# Patient Record
Sex: Male | Born: 1935
Health system: Southern US, Community
[De-identification: ages and names within clinical notes are randomized; demographics above are authoritative.]

## PROBLEM LIST (undated history)

## (undated) DIAGNOSIS — I10 Essential (primary) hypertension: Secondary | ICD-10-CM

## (undated) DIAGNOSIS — N182 Chronic kidney disease, stage 2 (mild): Secondary | ICD-10-CM

## (undated) DIAGNOSIS — I509 Heart failure, unspecified: Secondary | ICD-10-CM

## (undated) DIAGNOSIS — I219 Acute myocardial infarction, unspecified: Secondary | ICD-10-CM

## (undated) HISTORY — PX: CORONARY STENT PLACEMENT: SHX1402

## (undated) HISTORY — DX: Acute myocardial infarction, unspecified: I21.9

---

## 2001-12-23 ENCOUNTER — Emergency Department (HOSPITAL_COMMUNITY): Admission: EM | Admit: 2001-12-23 | Discharge: 2001-12-23 | Payer: Self-pay | Admitting: *Deleted

## 2003-08-11 ENCOUNTER — Emergency Department (HOSPITAL_COMMUNITY): Admission: AD | Admit: 2003-08-11 | Discharge: 2003-08-11 | Payer: Self-pay | Admitting: Family Medicine

## 2003-08-13 ENCOUNTER — Emergency Department (HOSPITAL_COMMUNITY): Admission: AD | Admit: 2003-08-13 | Discharge: 2003-08-13 | Payer: Self-pay | Admitting: Family Medicine

## 2003-08-22 ENCOUNTER — Emergency Department (HOSPITAL_COMMUNITY): Admission: AD | Admit: 2003-08-22 | Discharge: 2003-08-22 | Payer: Self-pay | Admitting: Family Medicine

## 2007-12-05 ENCOUNTER — Inpatient Hospital Stay (HOSPITAL_COMMUNITY): Admission: EM | Admit: 2007-12-05 | Discharge: 2007-12-08 | Payer: Self-pay | Admitting: Emergency Medicine

## 2007-12-05 ENCOUNTER — Ambulatory Visit: Payer: Self-pay | Admitting: Cardiovascular Disease

## 2007-12-12 ENCOUNTER — Ambulatory Visit: Payer: Self-pay | Admitting: Cardiovascular Disease

## 2007-12-21 ENCOUNTER — Encounter (HOSPITAL_COMMUNITY): Admission: RE | Admit: 2007-12-21 | Discharge: 2008-03-20 | Payer: Self-pay | Admitting: Cardiovascular Disease

## 2008-01-05 ENCOUNTER — Ambulatory Visit: Payer: Self-pay | Admitting: Cardiology

## 2008-03-21 ENCOUNTER — Ambulatory Visit: Payer: Self-pay | Admitting: Cardiovascular Disease

## 2008-03-22 ENCOUNTER — Encounter (HOSPITAL_COMMUNITY): Admission: RE | Admit: 2008-03-22 | Discharge: 2008-04-24 | Payer: Self-pay | Admitting: Cardiovascular Disease

## 2008-03-29 ENCOUNTER — Ambulatory Visit: Payer: Self-pay | Admitting: Cardiology

## 2008-07-31 ENCOUNTER — Ambulatory Visit: Payer: Self-pay | Admitting: Cardiology

## 2008-08-26 ENCOUNTER — Ambulatory Visit: Payer: Self-pay | Admitting: Cardiovascular Disease

## 2008-10-31 DIAGNOSIS — E039 Hypothyroidism, unspecified: Secondary | ICD-10-CM

## 2008-10-31 DIAGNOSIS — F17200 Nicotine dependence, unspecified, uncomplicated: Secondary | ICD-10-CM

## 2008-10-31 DIAGNOSIS — I252 Old myocardial infarction: Secondary | ICD-10-CM | POA: Insufficient documentation

## 2008-10-31 DIAGNOSIS — I2510 Atherosclerotic heart disease of native coronary artery without angina pectoris: Secondary | ICD-10-CM

## 2008-10-31 DIAGNOSIS — I10 Essential (primary) hypertension: Secondary | ICD-10-CM

## 2008-11-27 ENCOUNTER — Ambulatory Visit: Payer: Self-pay | Admitting: Cardiology

## 2009-04-23 ENCOUNTER — Ambulatory Visit: Payer: Self-pay | Admitting: Cardiovascular Disease

## 2009-04-23 DIAGNOSIS — E782 Mixed hyperlipidemia: Secondary | ICD-10-CM

## 2009-06-04 ENCOUNTER — Ambulatory Visit: Payer: Self-pay | Admitting: Cardiology

## 2009-06-05 ENCOUNTER — Encounter: Payer: Self-pay | Admitting: Cardiology

## 2009-09-26 ENCOUNTER — Encounter (INDEPENDENT_AMBULATORY_CARE_PROVIDER_SITE_OTHER): Payer: Self-pay | Admitting: *Deleted

## 2009-11-26 ENCOUNTER — Encounter: Payer: Self-pay | Admitting: Cardiovascular Disease

## 2009-11-27 ENCOUNTER — Ambulatory Visit: Payer: Self-pay | Admitting: Cardiology

## 2010-01-20 ENCOUNTER — Encounter (INDEPENDENT_AMBULATORY_CARE_PROVIDER_SITE_OTHER): Payer: Self-pay | Admitting: *Deleted

## 2010-03-02 ENCOUNTER — Emergency Department (HOSPITAL_COMMUNITY): Admission: EM | Admit: 2010-03-02 | Discharge: 2010-03-03 | Payer: Self-pay | Admitting: Emergency Medicine

## 2010-03-07 ENCOUNTER — Emergency Department (HOSPITAL_COMMUNITY): Admission: EM | Admit: 2010-03-07 | Discharge: 2010-03-07 | Payer: Self-pay | Admitting: Family Medicine

## 2010-03-12 ENCOUNTER — Encounter (INDEPENDENT_AMBULATORY_CARE_PROVIDER_SITE_OTHER): Payer: Self-pay | Admitting: *Deleted

## 2010-11-24 NOTE — Letter (Signed)
Summary: Appointment - Reminder 2  [employer], Main Office  [address]   [place], [place] [address]   Phone: [phone]  Fax: [phone]     Mar 12, 2010 MRN: [id number]   [name] [name] [address] [place], [place]  [address]   Dear [name],  Our records indicate that it is time to schedule a follow-up appointment with Dr. Eden Emms. It is very important that we reach you to schedule this appointment. We look forward to participating in your health care needs. Please contact us at the number listed above at your earliest convenience to schedule your appointment.  If you are unable to make an appointment at this time, give [place] a call so we can update our records.     Sincerely,   Migd[facility] D[facility] Scheduling Team

## 2010-11-24 NOTE — Miscellaneous (Signed)
  Clinical Lists Changes  Observations: Added new observation of RS STUDY: TRACER - study completion 11/26/09 (01/20/2010 11:31)      Research Study Name: TRACER - study completion 11/26/09

## 2011-03-09 NOTE — Discharge Summary (Signed)
NAME:  [name] [name], [name] [name]                 ACCOUNT NO.:  [id number]   MEDICAL RECORD NO.:  [id number]          PATIENT TYPE:  INP   LOCATION:  2041                         FACILITY:  [facility]   PHYSICIAN:  Jonelle Sidle, MD DATE OF BIRTH:  [birth year]   DATE OF ADMISSION:  12/05/2007  DATE OF DISCHARGE:  12/08/2007                               DISCHARGE SUMMARY   PROCEDURES:  1. Cardiac catheterization.  2. Coronary arteriogram.  3. Left ventriculogram.   PRIMARY FINAL DISCHARGE DIAGNOSES:  Non-ST segment elevation myocardial  infarction.   SECONDARY DIAGNOSIS:  1. Reported history of hypertension, borderline.  2. Ongoing tobacco use.  3. Dyslipidemia with a total cholesterol of 136, triglycerides 39, HDL      35, LDL 93.  4. Borderline hypothyroidism with a TSH of 0.153, free T4 1.09 and      free T3 2.0.   Time at discharge 43 minutes.   HOSPITAL COURSE:  [name] [name] is a 77 year old male with no previous  history of coronary artery disease.  He had chest pain which started  4:30 p.m. the day before admission and was intermittent.  He went to  urgent care at 2 p.m. on the day of admission and had an abnormal EKG as  well as ongoing mild chest discomfort.  He was taken to the cath lab.   His initial cardiac enzymes were elevated.  A heart catheterization  showed an EF in excess of 60% with a small area of inferobasal  hypokinesis, LAD 30, D1 50, ramus 70, circumflex normal, RCA 40-50,  thrombotic clot at the takeoff of a large PDA felt to be the culprit  lesion, bridging collaterals seen.  Dr. Riley Kill evaluated the films and  felt that medical therapy for this was recommended with aggressive  cardiac risk factor reduction.   A lipid profile is described above.  Initially he was placed on Lipitor  80 mg a day.  However, because of the need for him to be on  prescriptions available from [employer] for 4 dollars this was changed to  Pravachol 40.  His blood pressure initially was too  low to add an ACE  inhibitor or beta-blocker but eventually this improved somewhat and he  was started on low dose Coreg.  He was seen by cardiac rehab and given  information on the heart healthy lifestyle.  He was also given  information on smoking cessation but stated that he had decided to quit  and told his family of this decision.   By December 08, 2007, [name] [name] was ambulating 500 feet without chest  pain or shortness of breath and post ambulation blood pressure was  130/72 with a heart rate of 85.  He had been enrolled in the tracer  study by the research team on admission and he was provided with this  medication and instructions on taking it.  He was seen by smoking  cessation and the importance of tobacco cessation was reinforced.  On  December 08, 2007, [name] [name] was seen by Dr. Diona Browner.  Dr. Diona Browner  felt that it would be  ideal for him to stay another day but [name] [name]  felt strongly that he should be discharged.  He was ambulating without  chest pain or shortness of breath and discharged home on December 08, 2007.   DISCHARGE INSTRUCTIONS:  His activity level is to be increased  gradually.  He is not to do any driving for 4 days and no lifting for 2  weeks.  He is to call our office for problems with the cath site.  He  has a followup appoint with Dr. Eden Emms for medication titration on  February 17 at 9 a.m.  He also has a followup with Dr. Eden Emms on March 2  at 1:45.   DISCHARGE MEDICATIONS:  1. Pravachol 40 mg daily.  2. Aspirin 325 mg daily.  3. Nitroglycerin sublingual p.r.n.  4. Tracer study medication as directed.  5. Imdur 30 mg 1/2 tablet daily.  6. Coreg 3.125 mg b.i.d.  7. Plavix 75 mg daily.      Theodore Demark, PA-C      Jonelle Sidle, MD  Electronically Signed    RB/MEDQ  D:  12/08/2007  T:  12/10/2007  Job:  [id number]

## 2011-03-09 NOTE — Assessment & Plan Note (Signed)
[facility]                            CARDIOLOGY OFFICE NOTE   NAME:Pucillo, Yeudiel                          MRN:          [id number]  DATE:08/26/2008                            DOB:          08-May-1933    HISTORY OF PRESENT ILLNESS:  [name] [name] returns today for followup.  He has  had a history of an inferior wall MI in February 2009.  He was treated  with stenting of the RCA. He has not had any residual chest pain.  He  finished cardiac rehab.  He still needs to get a primary care doctor.  He used to see Dr. Margaretmary Bayley, I told him to see if he could get in  with him and if not I would arrange followup with him at the [facility].  He is getting assistance for his Plavix.   He is active.  He is not having significant chest pain, PND, or  orthopnea.  His coronary risk factors are well modified.  He needs to  have a followup lipid and liver profile.  We will arrange this in the  next 3-4 weeks.  Review of systems is negative.  He has been enjoying  himself, cooking fish quite a bit.  He seems to be a little more  attentive to his healthcare needs.  When I initially saw him with his  MI, he had not been seen by a doctor in 25 years.   CURRENT MEDICATIONS:  1. Plavix 75 a day.  2. Carvedilol 3.125 b.i.d.  3. Pravastatin 40 a day.  4. Aspirin a day.  5. Lisinopril 40 a day.  6. Hydrochlorothiazide 12.5 a day.  7. Isordil 30 a day.   PHYSICAL EXAMINATION:  GENERAL:  Remarkable for an elderly black male in  no distress.  VITAL SIGNS:  His weight is 152, blood pressure 120/76, pulse is 80 and  regular, respiratory rate 14, afebrile.  HEENT:  Unremarkable.  NECK:  Carotids are normal without bruit.  No lymphadenopathy,  thyromegaly, or JVP elevation.  LUNGS:  Clear.  Good diaphragmatic motion.  No wheezing.  S1 and S2.  Normal heart sounds.  PMI normal.  ABDOMEN:  Benign.  Bowel sounds are positive.  No AAA.  No tenderness.  No bruit.  No  hepatosplenomegaly.  No hepatojugular reflux.  No  tenderness.  EXTREMITIES:  Distal pulses are intact.  No edema.  NEURO:  Nonfocal.  No muscular weakness.  SKIN:  Warm and dry.   IMPRESSION:  1. Coronary artery disease with stenting of the right coronary artery      in February, left ventricular function is preserved.  No angina.      Continue aspirin and beta-blocker.  2. Hypercholesterolemia.  Liver and lipid check in the next few weeks,      not having any side effects.  Target goal of LDL would be less than      80.  3. Hypertension, currently well controlled.  Continue low-sodium diet      and ACE inhibitor.   Overall, I think Dorsel is doing well.  Hopefully, he will get hooked up  with the primary care doctor, and I will see him in 6 months' time.     Noralyn Pick. Eden Emms, MD, [facility]  Electronically Signed    PCN/MedQ  DD: 08/26/2008  DT: 08/27/2008  Job #: [id number]

## 2011-03-09 NOTE — Assessment & Plan Note (Signed)
 [facility]ALTHCARE                            CARDIOLOGY OFFICE NOTE   NAME:[name] [name]                          MRN:          [id number]  DATE:03/21/2008                            DOB:          23-May-1933    [name] returns today for followup.  He had an inferior wall myocardial  infarction in February.  He was treated with a stent.  He is doing well.  He denies any significant chest pain.  He is graduating from cardiac  rehab tomorrow.  He has been compliant with his meds.  Prior to his MI  he had not been seen by a doctor in quite some time and not taking care  of himself.  He had had untreated hypertension and hypercholesterolemia.   He is currently compliant with his meds.  We have him on all generics  except for Plavix and he is getting assistance for this.   REVIEW OF SYSTEMS:  Review of systems remarkable for no indigestion  which was his anginal equivalent, no chest pain, no PND or orthopnea, no  palpitations or syncope.   His meds include:  1. Plavix 75 a day.  2. Carvedilol 3.125 b.i.d.  3. Pravastatin 40 a day.  4. An aspirin a day.  5. Lisinopril 40 a day.  6. Hydrochlorothiazide 12.5.  7. Isordil 30 a day.   PHYSICAL EXAMINATION:  His exam is remarkable for a thin, black male in  no distress.  Affect is appropriate.  Weight is 153, blood pressure is 135/81, pulse 85 and regular, afebrile.  Respiratory 14.  HEENT:  Unremarkable.  Carotids are normal without bruits.  No lymphadenopathy, thyromegaly, or  JVP elevation.  LUNGS:  Clear, good diaphragmatic motion, no wheezing.  HEART:  S1, S2, normal heart sounds, PMI normal.  ABDOMEN:  Benign, bowel sounds positive, no AAA, no tenderness, no  bruit, no hepatosplenomegaly or hepatojugular reflux.  No tenderness.  Distal pulses are intact, no edema.  NEURO:  Nonfocal.  SKIN:  Warm and dry with no muscular weakness.   IMPRESSION:  1. Inferior wall myocardial infarction stable, no chest  pain, continue      aspirin, Plavix and beta blocker.  Followup Myoview February 2010.  2. Hypertension, currently well controlled.  Continue low-salt diet.      Continue current dose of angiotensin-converting enzyme inhibitor,      diuretic, and beta blocker.  3. Hypercholesterolemia in the setting of coronary disease.  Continue      pravastatin, lipid and liver profile in 6 months.   Overall, I think [name] [name] is doing well and I will see him back in 6  months.     Noralyn Pick. Eden Emms, MD, [facility]  Electronically Signed    PCN/MedQ  DD: 03/21/2008  DT: 03/21/2008  Job #: [id number]

## 2011-03-09 NOTE — Assessment & Plan Note (Signed)
[facility]                            CARDIOLOGY OFFICE NOTE   NAME:[name] [name], [name] [name]                          MRN:          [id number]  DATE:12/12/2007                            DOB:          [birth year]    Luka returns today for followup.  He was in the hospital from February  10th  to December 08, 2007. He presented with indigestion for 14 hours  and this was essentially a late presentation with an inferior posterior  MI.  He had a clot and occlusion of the distal right coronary artery and  PDA.  He was angioplastied by Dr. Riley Kill. I will have to review the  nodes to see if he got a drug eluding stent or not.   Since hospital discharge, he has been doing well.  He really had not had  much medical care prior to being hospitalized. He had untreated  hypertension and hypercholesterolemia.  He has been compliant with his  meds except he has run out of Plavix and we were somewhat concerned  about this as his stent is fairly new. He was talking about going down  to [place] and [facility] to see if he can get some medications.  I was  not sure what place  he was talking about. However, we did have him fill  out Plavix assistance form here in the office and 2 weeks of Plavix was  able to be found for him.   He is taking an aspirin a day.  Has not had any recurrent chest pain or  indigestion.  Review of systems otherwise negative.   He has no known allergies.   CURRENT MEDICATIONS:  1. Plavix 75 a day.  2. Carvedilol 3.125 b.i.d.  3. Isordil 30 a day.  4. Pravastatin 40 a day.  5. Aspirin a day.   PHYSICAL EXAMINATION:  Is remarkable for blood pressure 140/70, pulse 73  and regular, afebrile, respiratory rate 14, weight 165.  HEENT:  Unremarkable.  Carotids normal without bruit, no  lymphadenopathy, thyromegaly, JVP elevation.  LUNGS:  Clear with good  diaphragmatic motion.  No wheezing.  S1-S2 with normal heart sounds.  PMI normal.  ABDOMEN:   Benign.  Bowel sounds positive. No  AAA, no tenderness. No  hepatosplenomegaly. No hepatojugular reflux.  Groin site is well healed, +3 pulses with no bruits. Distal pulses are  intact. No edema.  NEURO: Nonfocal.  SKIN:  Warm and dry.   EKG is normal with no evidence of significant infarction with LVH and  first-degree heart block, PR interval is 220.   IMPRESSION:  1. Inferior posterior wall myocardial infarction, stable.  No      recurrent symptoms; continue aspirin, Plavix and beta blocker.  2. Hypertension, currently well controlled.  Continue beta-blocker,      low-salt diet.  3. Hypercholesterolemia in the setting of coronary disease.  Continue      pravastatin 40 mg a day, lipid and liver profile in 6 months.   Overall, Can is doing well.  We will try to get him assistance with  his  Plavix.  He knows how important this drug is to him. I will see him  back in 3 months.     Noralyn Pick. Eden Emms, MD, [facility]  Electronically Signed    PCN/MedQ  DD: 12/12/2007  DT: 12/12/2007  Job #: [id number]

## 2011-03-09 NOTE — H&P (Signed)
NAME:  [name] [name], [name] [name]                 ACCOUNT NO.:  [id number]   MEDICAL RECORD NO.:  [id number]          PATIENT TYPE:  EMS   LOCATION:  MAJO                         FACILITY:  [facility]   PHYSICIAN:  Nicolasa Ducking, ANP DATE OF BIRTH:  Oct 14, 1933   DATE OF ADMISSION:  12/05/2007  DATE OF DISCHARGE:                              HISTORY & PHYSICAL   PRIMARY CARDIOLOGIST:  [facility] being seen by Noralyn Pick. Eden Emms,  MD.   PRIMARY CARE Meleny Tregoning:  None.   PATIENT PROFILE:  A 77 year old African-American male without prior  cardiac history who presents with non-ST-elevation MI.   PROBLEM LIST:  1. Non-ST-elevation MI/chest pain.  2. Hypertension.  3. Ongoing tobacco abuse.      a.     A 30 pack-year history, smoking half-a-pack a day for 60       years.   HISTORY OF PRESENT ILLNESS:  A 77 year old African-American male with a  history of hypertension, although he had been off of his medications for  about 2 years.  He also has a history of ongoing tobacco abuse, smoking  half-a-pack a day currently.  He was in his usual state of health until  approximately 4:30 p.m. yesterday, when after eating a hamburger, he  developed indigestion and tightness in his chest associated shortness  of breath with diaphoresis and intermittent nausea.  Symptoms persisted  intermittently throughout the night and he was very restless, and did  not sleep much.  This morning he had continued symptoms, and then  eventually presented to the [facility] at about 2:00 p.m.  There, an ECG was performed showing inferolateral ST-segment depression  with T-wave inversions.  He was sent to the [facility] ED where repeat  ECG showed this again.  He still had mild chest discomfort upon arrival,  and is given heparin and IV nitroglycerin with complete relief of  discomfort.  He is currently pain free.  His first set of cardiac  markers have revealed CK of 329, MB of greater than 80.0, and troponin-I  of 14.5.  Currently the patient is asymptomatic.   ALLERGIES:  No known drug allergies.   HOME MEDICATIONS:  None.   FAMILY HISTORY:  He thinks both parents died of aneurysms in her 74s.  He had three brothers, one died of alcoholism, one of trauma, and one is  alive and well.  He has two sisters who are alive and well.   SOCIAL HISTORY:  He lives in [place] with his son.  He is retired  from [employer]  He has a 30-pack-year history of tobacco  abuse, smoking half-a-pack a day a day for the past 60 years.  He  occasionally has an alcohol beverage.  He denies any drug use.  He does  not routinely exercise.   REVIEW OF SYSTEMS:  Positive for chest pain, shortness of breath,  dyspnea on exertion, diaphoresis, and nausea.  Otherwise all systems  reviewed are negative.   PHYSICAL EXAM:  VITAL SIGNS:  Temperature 97.6, heart rate 50,  respirations 20, blood pressure 102/65, pulse oximetry  96% on 4 liters.  GENERAL:  A pleasant African-American male in no acute distress.  Awake,  alert, and oriented x3.  HEENT:  Normal.  NEURO:  Grossly intact, nonfocal.  SKIN:  Warm and dry without lesions or masses.  NECK:  Soft right bruit.  No thyromegaly.  No lymphadenopathy.  LUNGS:  Respirations are regular and unlabored.  CARDIAC: Regular, bradycardic, S1-S2.  Distant heart sound.  No  appreciable murmurs.  ABDOMEN:  Round, soft, nontender, nondistended.  Bowel sounds present in  all 4.  EXTREMITIES:  Warm, dry, no clubbing, cyanosis with bilateral  femoral bruits.  Distal pulses are 1+.   Chest x-ray is pending.  EKG shows sinus bradycardia and a rate of 48  beats per minute.  He has 0.5 to 1 mm J-point depression with T-wave  inversion in leads I, II, III, aVF and V2-V6.  Lab work sodium 139,  potassium 3.7, chloride 104, CO2 of 25, BUN 12, creatinine 1.35, glucose  159, AST 207, albumin 4.1, total protein 7.1, calcium 10.1.  CK 329, MB  greater than 80, troponin-I  14.5.   ASSESSMENT AND PLAN:  1. Non-ST-elevation myocardial infarction.  The patient is currently      pain free on heparin and  nitroglycerin.  We have added aspirin and      Plavix as well.  We will go ahead and dose him with high-dose      Lipitor, and will hold off on beta blocker at this point, as his      heart rate is drifting anywhere between the 40s and 50s.  As he is      pain free, we will forego the cath lab at this time, and continue      anticoagulation and nitrates overnight, and plan on cardiac      catheterization in the a.m.  If he has any discomfort and he has      been told to tell us of any discomfort, we would plan to go to the      cath lab emergently tonight.  We will have consent signed and      placed on the chart.  2. Tobacco abuse:  Cessation strongly advised.  3. History of hypertension.  Right now he is on IV nitro and pressure      looks good.  When he came in, prior to nitro, his pressures in the      160s.  With his bradycardia, we are going to hold off on beta      blocker.  Pending EF he may most benefit from an ACE inhibitor.  4. Lipid status currently unknown, add Lipitor 80 and check lipids and      LFTs.  5. Ongoing tobacco abuse, smoking cessation strongly advised.  Will      obtain a smoking cessation consult for this patient.      Nicolasa Ducking, ANP     CB/MEDQ  D:  12/05/2007  T:  12/07/2007  Job:  [id number]

## 2011-03-09 NOTE — Cardiovascular Report (Signed)
NAME:  [name] [name]                 ACCOUNT NO.:  [id number]   MEDICAL RECORD NO.:  [id number]          PATIENT TYPE:  INP   LOCATION:  2114                         FACILITY:  [facility]   PHYSICIAN:  [name] Morton. Riley Kill, MD, FACCDATE OF BIRTH:  July 01, 1933   DATE OF PROCEDURE:  12/06/2007  DATE OF DISCHARGE:                            CARDIAC CATHETERIZATION   INDICATIONS:  [name] [name] is a 77 year old gentleman who presents with  chest discomfort.  He has not had pain in the last 24 hours.  His  enzymes were positive.  His EKG is consistent with left ventricular  hypertrophy with some repolarization abnormality.  He has some T-wave  inversion in the inferior leads, and in the anterolateral leads. The  current study is done to assess coronary anatomy.   PROCEDURE:  1. Left heart catheterization.  2. Selective coronary arteriography.  3. Selective left ventriculography.   Following informed consent, the patient was brought to the  catheterization laboratory.  He was on the Tracer Study, 5-French  catheters were utilized.  He tolerated the procedure well, and there  were no complications.  We reviewed the films, carefully.  Medical  therapy was recommended as initial treatment.  He was taken to the  holding area in satisfactory clinical condition for sheath removal.   HEMODYNAMIC DATA:  1. Central aortic pressure was 116/64.  2. Left ventricular pressure 113/22.  3. There is no gradient pullback across aortic valve.   ANGIOGRAPHIC DATA:  1. Ventriculography was done in the RAO projection.  Overall ejection      fraction appears to be in excess of 60%.  There is a little an area      of inferobasal hypokinesis compatible with his known posterolateral      occlusion.  2. The left main is free of critical disease.  3. The left anterior descending artery has about 30% narrowing at the      takeoff of septal and the diagonal.  The first diagonal has about      50% narrowing and is a smaller  caliber vessel.  The second diagonal      is a larger caliber vessel and has about 30% narrowing noted in the      LAO cranial view.  The mid-and-distal LAD is a large-caliber      vessel, and courses to the apex.  Other than minor luminal      irregularity, it appears to be free of critical disease.  It does      wrap the apical tip.  4. The ramus intermedius is a moderate caliber vessel that has about a      70% area of mid focal stenosis  5. The circumflex proper provides a large marginal branch and an AV      circumflex.  Other than minor luminal irregularity it is free of      critical disease.  6. The right coronary artery is tortuous.  There is a lot of plaquing      throughout the right coronary.  There is 30%-40% narrowing of the  proximal mid junction and 40%-50% narrowing distally.  After the      takeoff of a large PDA which bifurcates, there is a thrombotic      clot.  This is compatible with a filling defect, and likely      represents the infarct-related artery.  There are bridging      collaterals as well as collaterals from the left system.  The      bridging collaterals appear to be from the PDA system retrograde.      Both appear to represent a small area of disease.   CONCLUSIONS:  1. Well-preserved overall LV function with a small inferobasal wall      motion abnormality and an EF in excess of 60%.  2. Total occlusion of the distal right coronary artery representing      the posterolateral system with evidence of left-to-right      collaterals.  3. Moderate stenosis of the ramus intermedius as defined above.   DISPOSITION:  The patient is now more than 24 hours from the onset of  his infarct, and he is pain free.  The current study suggests  posterolateral occlusion in a fairly small territory.  The benefit of  percutaneous intervention would be limited in the absence of inducible  ischemia at this point.  He does have a 70% stenosis of the ramus   intermedius.  Aggressive medical therapy, discontinuation of smoking,  and appropriate medical intervention is warranted.      [name] Morton. Riley Kill, MD, [facility]  Electronically Signed     TDS/MEDQ  D:  12/06/2007  T:  12/07/2007  Job:  [id number]   cc:   [name] Pick. Eden Emms, MD, [facility]  Cardiovascular Laboratory

## 2011-07-16 LAB — COMPREHENSIVE METABOLIC PANEL
ALT: 35
AST: 176 — ABNORMAL HIGH
BUN: 12
CO2: 25
Calcium: 10.1
Calcium: 8.8
Chloride: 104
Chloride: 108
Creatinine, Ser: 1.17
Creatinine, Ser: 1.35
GFR calc Af Amer: >60
Potassium: 3.5
Potassium: 3.7
Sodium: 139
Total Bilirubin: 0.9
Total Bilirubin: 1.1
Total Protein: 6.1
Total Protein: 7.1

## 2011-07-16 LAB — DIFFERENTIAL
Basophils Absolute: 0
Basophils Relative: 1
Eosinophils Relative: 0
Lymphocytes Relative: 8 — ABNORMAL LOW
Lymphs Abs: 1.2
Monocytes Relative: 12
Neutro Abs: 11.9 — ABNORMAL HIGH
Neutrophils Relative %: 79 — ABNORMAL HIGH

## 2011-07-16 LAB — LIPID PANEL
Cholesterol: 136
VLDL: 8

## 2011-07-16 LAB — CBC
HCT: 35.5 — ABNORMAL LOW
HCT: 39.8
Hemoglobin: 11.9 — ABNORMAL LOW
Hemoglobin: 12.1 — ABNORMAL LOW
MCHC: 34
MCHC: 34
MCV: 96.1
MCV: 96.8
Platelets: 147 — ABNORMAL LOW
Platelets: 149 — ABNORMAL LOW
Platelets: 154
Platelets: 160
Platelets: 175
RBC: 3.7 — ABNORMAL LOW
RBC: 3.72 — ABNORMAL LOW
RBC: 4.13 — ABNORMAL LOW
RDW: 13.6
RDW: 14.1
RDW: 14.2
WBC: 15 — ABNORMAL HIGH

## 2011-07-16 LAB — CK TOTAL AND CKMB (NOT AT ARMC): CK, MB: 192 — ABNORMAL HIGH

## 2011-07-16 LAB — CARDIAC PANEL(CRET KIN+CKTOT+MB+TROPI)
CK, MB: 103 — ABNORMAL HIGH
CK, MB: 19.8 — ABNORMAL HIGH
CK, MB: 35.1 — ABNORMAL HIGH
CK, MB: 44.5 — ABNORMAL HIGH
CK, MB: 6.1 — ABNORMAL HIGH
Relative Index: 1.5
Relative Index: 3.2 — ABNORMAL HIGH
Relative Index: 3.9 — ABNORMAL HIGH
Relative Index: 6.2 — ABNORMAL HIGH
Total CK: 1093 — ABNORMAL HIGH
Total CK: 1668 — ABNORMAL HIGH
Total CK: 652 — ABNORMAL HIGH
Total CK: 815 — ABNORMAL HIGH
Troponin I: 9.11

## 2011-07-16 LAB — I-STAT 8, (EC8 V) (CONVERTED LAB)
Acid-base deficit: 3 — ABNORMAL HIGH
Bicarbonate: 24.6 — ABNORMAL HIGH
Chloride: 107
Glucose, Bld: 150 — ABNORMAL HIGH
Hemoglobin: 15.6
Sodium: 141
pCO2, Ven: 50.5 — ABNORMAL HIGH
pH, Ven: 7.295

## 2011-07-16 LAB — APTT: aPTT: 32

## 2011-07-16 LAB — BASIC METABOLIC PANEL
Calcium: 8.5
GFR calc non Af Amer: >60
Glucose, Bld: 98
Sodium: 140

## 2011-07-16 LAB — POCT CARDIAC MARKERS
CKMB, poc: >80
Troponin i, poc: 14.5

## 2011-07-16 LAB — POCT I-STAT CREATININE
Creatinine, Ser: 0.9
Operator id: 295021

## 2011-07-16 LAB — PROTIME-INR: Prothrombin Time: 13.2

## 2011-07-16 LAB — T4, FREE: Free T4: 1.09

## 2012-10-10 ENCOUNTER — Other Ambulatory Visit (HOSPITAL_COMMUNITY): Payer: Self-pay | Admitting: Family Medicine

## 2012-10-10 ENCOUNTER — Ambulatory Visit (HOSPITAL_COMMUNITY)
Admission: RE | Admit: 2012-10-10 | Discharge: 2012-10-10 | Disposition: A | Discharge status: Home / Self Care | Payer: Medicare HMO | Source: Ambulatory Visit | Attending: Family Medicine | Admitting: Family Medicine

## 2012-10-10 ENCOUNTER — Other Ambulatory Visit: Payer: Self-pay

## 2012-10-10 DIAGNOSIS — R52 Pain, unspecified: Secondary | ICD-10-CM

## 2012-10-10 DIAGNOSIS — R9431 Abnormal electrocardiogram [ECG] [EKG]: Secondary | ICD-10-CM | POA: Insufficient documentation

## 2012-10-10 DIAGNOSIS — I10 Essential (primary) hypertension: Secondary | ICD-10-CM | POA: Insufficient documentation

## 2012-10-10 LAB — COMPREHENSIVE METABOLIC PANEL
ALT: 10 U/L (ref 0–53)
Alkaline Phosphatase: 107 U/L (ref 39–117)
CO2: 25 mEq/L (ref 19–32)
GFR calc Af Amer: 74 mL/min — ABNORMAL LOW (ref >90)
GFR calc non Af Amer: 63 mL/min — ABNORMAL LOW (ref >90)
Glucose, Bld: 110 mg/dL — ABNORMAL HIGH (ref 70–99)
Potassium: 3.9 mEq/L (ref 3.5–5.1)
Sodium: 144 mEq/L (ref 135–145)

## 2012-10-10 LAB — PSA: PSA: 3.92 ng/mL (ref <=4.00)

## 2012-10-10 LAB — CBC
Hemoglobin: 16.5 g/dL (ref 13.0–17.0)
RBC: 5.04 MIL/uL (ref 4.22–5.81)
WBC: 10.7 10*3/uL — ABNORMAL HIGH (ref 4.0–10.5)

## 2012-10-10 LAB — TSH: TSH: 0.787 u[IU]/mL (ref 0.350–4.500)

## 2012-10-10 LAB — LIPID PANEL
LDL Cholesterol: 120 mg/dL — ABNORMAL HIGH (ref 0–99)
Total CHOL/HDL Ratio: 3.7 RATIO

## 2014-06-06 ENCOUNTER — Ambulatory Visit (HOSPITAL_COMMUNITY)
Admission: RE | Admit: 2014-06-06 | Discharge: 2014-06-06 | Disposition: A | Discharge status: Home / Self Care | Payer: Medicare HMO | Source: Ambulatory Visit | Attending: Internal Medicine | Admitting: Internal Medicine

## 2014-06-06 ENCOUNTER — Other Ambulatory Visit: Payer: Self-pay | Admitting: Internal Medicine

## 2014-06-06 ENCOUNTER — Encounter (INDEPENDENT_AMBULATORY_CARE_PROVIDER_SITE_OTHER): Payer: Self-pay

## 2014-06-06 DIAGNOSIS — I252 Old myocardial infarction: Secondary | ICD-10-CM | POA: Diagnosis not present

## 2014-06-06 DIAGNOSIS — R0602 Shortness of breath: Secondary | ICD-10-CM

## 2014-06-06 DIAGNOSIS — J449 Chronic obstructive pulmonary disease, unspecified: Secondary | ICD-10-CM | POA: Insufficient documentation

## 2014-06-06 DIAGNOSIS — J4489 Other specified chronic obstructive pulmonary disease: Secondary | ICD-10-CM | POA: Insufficient documentation

## 2014-06-06 DIAGNOSIS — F17200 Nicotine dependence, unspecified, uncomplicated: Secondary | ICD-10-CM | POA: Diagnosis not present

## 2014-06-08 ENCOUNTER — Emergency Department (HOSPITAL_COMMUNITY): Payer: Medicare HMO

## 2014-06-08 ENCOUNTER — Encounter (HOSPITAL_COMMUNITY): Payer: Self-pay | Admitting: Emergency Medicine

## 2014-06-08 ENCOUNTER — Emergency Department (HOSPITAL_COMMUNITY)
Admission: EM | Admit: 2014-06-08 | Discharge: 2014-06-08 | Disposition: A | Discharge status: Home / Self Care | Payer: Medicare HMO | Attending: Emergency Medicine | Admitting: Emergency Medicine

## 2014-06-08 DIAGNOSIS — S99919A Unspecified injury of unspecified ankle, initial encounter: Principal | ICD-10-CM

## 2014-06-08 DIAGNOSIS — M545 Low back pain, unspecified: Secondary | ICD-10-CM

## 2014-06-08 DIAGNOSIS — Y92410 Unspecified street and highway as the place of occurrence of the external cause: Secondary | ICD-10-CM | POA: Insufficient documentation

## 2014-06-08 DIAGNOSIS — I10 Essential (primary) hypertension: Secondary | ICD-10-CM | POA: Insufficient documentation

## 2014-06-08 DIAGNOSIS — IMO0002 Reserved for concepts with insufficient information to code with codable children: Secondary | ICD-10-CM | POA: Diagnosis not present

## 2014-06-08 DIAGNOSIS — Y9389 Activity, other specified: Secondary | ICD-10-CM | POA: Diagnosis not present

## 2014-06-08 DIAGNOSIS — F17200 Nicotine dependence, unspecified, uncomplicated: Secondary | ICD-10-CM | POA: Insufficient documentation

## 2014-06-08 DIAGNOSIS — S8990XA Unspecified injury of unspecified lower leg, initial encounter: Secondary | ICD-10-CM | POA: Diagnosis not present

## 2014-06-08 DIAGNOSIS — S8991XA Unspecified injury of right lower leg, initial encounter: Secondary | ICD-10-CM

## 2014-06-08 DIAGNOSIS — S99929A Unspecified injury of unspecified foot, initial encounter: Principal | ICD-10-CM

## 2014-06-08 HISTORY — DX: Essential (primary) hypertension: I10

## 2014-06-08 NOTE — Discharge Instructions (Signed)
Take tylenol or ibuprofen as needed for pain. Refer to attached documents for more information. Return to the ED with worsening or concerning symptoms.  °
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
## 2014-06-08 NOTE — ED Notes (Signed)
Bed: WA02 Expected date:  Expected time:  Means of arrival:  Comments: 
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
## 2015-06-08 ENCOUNTER — Emergency Department (HOSPITAL_COMMUNITY): Payer: Commercial Managed Care - HMO

## 2015-06-08 ENCOUNTER — Encounter (HOSPITAL_COMMUNITY): Payer: Self-pay | Admitting: Emergency Medicine

## 2015-06-08 ENCOUNTER — Emergency Department (HOSPITAL_COMMUNITY)
Admission: EM | Admit: 2015-06-08 | Discharge: 2015-06-08 | Disposition: A | Discharge status: Home / Self Care | Payer: Commercial Managed Care - HMO | Attending: Emergency Medicine | Admitting: Emergency Medicine

## 2015-06-08 DIAGNOSIS — S8991XA Unspecified injury of right lower leg, initial encounter: Secondary | ICD-10-CM | POA: Insufficient documentation

## 2015-06-08 DIAGNOSIS — M79651 Pain in right thigh: Secondary | ICD-10-CM | POA: Diagnosis not present

## 2015-06-08 DIAGNOSIS — Z720 Tobacco use: Secondary | ICD-10-CM | POA: Insufficient documentation

## 2015-06-08 DIAGNOSIS — Y92008 Other place in unspecified non-institutional (private) residence as the place of occurrence of the external cause: Secondary | ICD-10-CM | POA: Diagnosis not present

## 2015-06-08 DIAGNOSIS — I10 Essential (primary) hypertension: Secondary | ICD-10-CM | POA: Diagnosis not present

## 2015-06-08 DIAGNOSIS — Y998 Other external cause status: Secondary | ICD-10-CM | POA: Insufficient documentation

## 2015-06-08 DIAGNOSIS — Y9389 Activity, other specified: Secondary | ICD-10-CM | POA: Diagnosis not present

## 2015-06-08 DIAGNOSIS — W1839XA Other fall on same level, initial encounter: Secondary | ICD-10-CM | POA: Diagnosis not present

## 2015-06-08 DIAGNOSIS — M79604 Pain in right leg: Secondary | ICD-10-CM | POA: Diagnosis not present

## 2015-06-08 DIAGNOSIS — S79921A Unspecified injury of right thigh, initial encounter: Secondary | ICD-10-CM | POA: Diagnosis not present

## 2015-06-08 DIAGNOSIS — R52 Pain, unspecified: Secondary | ICD-10-CM

## 2015-06-08 MED ORDER — TRAMADOL HCL 50 MG PO TABS
50.0000 mg | ORAL_TABLET | Freq: Once | ORAL | Status: AC
  Administered 2015-06-08: 50 mg via ORAL
  Filled 2015-06-08: qty 1

## 2015-06-08 MED ORDER — DIAZEPAM 2 MG PO TABS: 2.0000 mg | ORAL_TABLET | Freq: Three times a day (TID) | ORAL | Status: DC | PRN

## 2015-06-08 MED ORDER — DIAZEPAM 2 MG PO TABS
2.0000 mg | ORAL_TABLET | Freq: Once | ORAL | Status: AC
  Administered 2015-06-08: 2 mg via ORAL
  Filled 2015-06-08: qty 1

## 2015-06-08 MED ORDER — TRAMADOL HCL 50 MG PO TABS: 50.0000 mg | ORAL_TABLET | Freq: Once | ORAL | Status: DC

## 2015-06-08 NOTE — ED Notes (Signed)
Ortho tech notified.  

## 2015-06-08 NOTE — ED Notes (Addendum)
Pt reports he was sitting on his porch this afternoon with his R leg curled up under him. Pt went to move and fell off porch. Larey Seat 3' and landed on grass on top of R leg. Pt reports R posterior thigh pain. No deformity noted, but pt reports he has not been able to stand on leg yet. Did not hit head, no LOC.

## 2015-06-08 NOTE — ED Provider Notes (Signed)
CSN: [id number]     Arrival date & time 06/08/15  1346 History   First MD Initiated Contact with Patient 06/08/15 1706     Chief Complaint  Patient presents with  . Fall  . Leg Pain     (Consider location/radiation/quality/duration/timing/severity/associated sxs/prior Treatment) HPI  Mechanical fall from porch laying on his leg in a flexed hip flexed knee plantar flexed foot position. Severe pain to the posterior side of that leg with worsening pain when walking. No pain distally or elsewhere. Did not hit his head. No loss of consciousness. No other associated symptoms.  Past Medical History  Diagnosis Date  . Hypertension    History reviewed. No pertinent past surgical history. History reviewed. No pertinent family history. Social History  Substance Use Topics  . Smoking status: Light Tobacco Smoker  . Smokeless tobacco: None  . Alcohol Use: Yes    Review of Systems  Constitutional: Negative for fever and chills.  HENT: Negative for congestion and rhinorrhea.   Eyes: Negative for visual disturbance.  Respiratory: Negative for cough and shortness of breath.   Cardiovascular: Negative for chest pain.  Gastrointestinal: Negative for vomiting, abdominal pain, diarrhea and constipation.  Endocrine: Negative for polyuria.  Genitourinary: Negative for dysuria and flank pain.  Musculoskeletal: Negative for back pain and neck pain.       Right leg pain.  Skin: Negative for rash and wound.  Neurological: Negative for dizziness, numbness and headaches.  All other systems reviewed and are negative.     Allergies  Review of patient's allergies indicates no known allergies.  Home Medications   Prior to Admission medications   Medication Sig Start Date End Date Taking? Authorizing Provider  diazepam (VALIUM) 2 MG tablet Take 1 tablet (2 mg total) by mouth every 8 (eight) hours as needed for anxiety. 06/08/15   Marily Memos, MD  traMADol (ULTRAM) 50 MG tablet Take 1 tablet (50  mg total) by mouth once. 06/08/15   Barbara Cower Keslie Gritz, MD   BP 162/89 mmHg  Pulse 65  Temp(Src) 98.3 F (36.8 C) (Oral)  Resp 18  SpO2 97% Physical Exam  Constitutional: He is oriented to person, place, and time. He appears well-developed and well-nourished.  HENT:  Head: Normocephalic and atraumatic.  Eyes: Conjunctivae and EOM are normal.  Neck: Normal range of motion. Neck supple.  Cardiovascular: Normal rate and regular rhythm.   Pulmonary/Chest: Effort normal. No respiratory distress.  Abdominal: Soft. There is no tenderness.  Musculoskeletal: Normal range of motion. He exhibits no edema or tenderness.  Neurological: He is alert and oriented to person, place, and time.  Skin: Skin is warm and dry.  Nursing note and vitals reviewed.   ED Course  Procedures (including critical care time) Labs Review Labs Reviewed - No data to display  Imaging Review Dg Femur, Min 2 Views Right  06/08/2015   CLINICAL DATA:  Status post fall down stairs, right thigh pain, unable to bear weight  EXAM: RIGHT FEMUR 2 VIEWS  COMPARISON:  None.  FINDINGS: No fracture or dislocation is seen.  Right hip joint is preserved.  Visualized bony pelvis is intact.  Visualized soft tissues are within normal limits.  Vascular calcifications.  IMPRESSION: No fracture or dislocation is seen.   Electronically Signed   By: Charline Bills M.D.   On: 06/08/2015 15:07   I, Joylyn Duggin, Barbara Cower, personally reviewed and evaluated these images and lab results as part of my medical decision-making.   EKG Interpretation None  MDM   Final diagnoses:  Pain   81 year old male with mechanical fall from porch with persistent right posterior leg pain without fracture on x-ray. No significant tenderness to palpation however worse with attempting to fully extend at the knee. Concern for possible muscular or ligamentous injury so put in a knee immobilizer and crutches along with pain meds. Will FU w/ orthopedics in a week.  Consulted SW for help with PT/OT but won't help until in the morning.   I have personally and contemperaneously reviewed labs and imaging and used in my decision making as above.   A medical screening exam was performed and I feel the patient has had an appropriate workup for their chief complaint at this time and likelihood of emergent condition existing is low. They have been counseled on decision, discharge, follow up and which symptoms necessitate immediate return to the emergency department. They or their family verbally stated understanding and agreement with plan and discharged in stable condition.      Marily Memos, MD 06/09/15 [phone]

## 2015-06-09 ENCOUNTER — Telehealth: Payer: Self-pay | Admitting: *Deleted

## 2015-06-23 NOTE — Telephone Encounter (Signed)
Pt was referred to Ortho MD for Follow up after discharge from ED. No CM needs at present.

## 2015-06-24 NOTE — ED Provider Notes (Signed)
[name] [name], [place] 10-Jun-1933, MR [id number]  [name] [name] returned to the ER asking for assistance getting cleared to return to work.  He was seen and evaluated on 06/08/15 for right leg pain by Dr. Clayborne Dana after a mechanical fall from his porch.  There was concern for ligamentous injury, so pt was place in a knee immobilizer and given ortho f/u.  After his ER visit, he reports wearing the leg brace and using crutches for 2 days, but had no difficulty on day 3.  His pain was isolated to a "pulled hamstring", and he denies knee swelling, redness, instability.  His PCP no longer accepts his insurance and the orthopedic surgeon that he was referred to would not evaluate him without a referral from his PCP.  He would like to return to work.  Today his knee appears normal, without tenderness, erythema, edema.  He has no difficulty with ambulation, normal gait, normal ROM.  There is no tenderness with palpation to popliteal fossa or to lateral & medial hamstring tendons.   Kim from case management has investigated the situation and gave [name] [name] a list of PCP's that accept his insurance.  He was advised that he can still see his PCP, but would need to pay cash, or he may want to get established with a new PCP.  He was given a note stating that he may return to work today without any restrictions.  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    Danelle Berry, PA-C   Danelle Berry, PA-C 06/24/15 1054  Marily Memos, MD 06/26/15 [phone]

## 2015-06-24 NOTE — ED Notes (Signed)
Pt returned today.  Needs work note to return to work.  Injury has improved. Unable to get in to ortho.  PA Leisa viewed leg and approved note.  Pt was never officially taken out of work.

## 2017-01-02 ENCOUNTER — Emergency Department (HOSPITAL_COMMUNITY)
Admission: EM | Admit: 2017-01-02 | Discharge: 2017-01-02 | Disposition: A | Discharge status: Home / Self Care | Payer: Medicare HMO | Attending: Emergency Medicine | Admitting: Emergency Medicine

## 2017-01-02 ENCOUNTER — Encounter (HOSPITAL_COMMUNITY): Payer: Self-pay | Admitting: Emergency Medicine

## 2017-01-02 ENCOUNTER — Emergency Department (HOSPITAL_COMMUNITY): Payer: Medicare HMO

## 2017-01-02 DIAGNOSIS — E039 Hypothyroidism, unspecified: Secondary | ICD-10-CM | POA: Insufficient documentation

## 2017-01-02 DIAGNOSIS — J9801 Acute bronchospasm: Secondary | ICD-10-CM | POA: Diagnosis not present

## 2017-01-02 DIAGNOSIS — J219 Acute bronchiolitis, unspecified: Secondary | ICD-10-CM | POA: Diagnosis not present

## 2017-01-02 DIAGNOSIS — I252 Old myocardial infarction: Secondary | ICD-10-CM | POA: Diagnosis not present

## 2017-01-02 DIAGNOSIS — I10 Essential (primary) hypertension: Secondary | ICD-10-CM | POA: Diagnosis not present

## 2017-01-02 DIAGNOSIS — F17200 Nicotine dependence, unspecified, uncomplicated: Secondary | ICD-10-CM | POA: Insufficient documentation

## 2017-01-02 DIAGNOSIS — I2510 Atherosclerotic heart disease of native coronary artery without angina pectoris: Secondary | ICD-10-CM | POA: Insufficient documentation

## 2017-01-02 DIAGNOSIS — J209 Acute bronchitis, unspecified: Secondary | ICD-10-CM

## 2017-01-02 DIAGNOSIS — R0602 Shortness of breath: Secondary | ICD-10-CM | POA: Diagnosis not present

## 2017-01-02 MED ORDER — ALBUTEROL SULFATE HFA 108 (90 BASE) MCG/ACT IN AERS: 2.0000 | INHALATION_SPRAY | RESPIRATORY_TRACT | 1 refills | Status: AC | PRN

## 2017-01-02 MED ORDER — IPRATROPIUM BROMIDE 0.02 % IN SOLN
0.5000 mg | Freq: Once | RESPIRATORY_TRACT | Status: AC
  Administered 2017-01-02: 0.5 mg via RESPIRATORY_TRACT
  Filled 2017-01-02: qty 2.5

## 2017-01-02 MED ORDER — ALBUTEROL SULFATE HFA 108 (90 BASE) MCG/ACT IN AERS
2.0000 | INHALATION_SPRAY | Freq: Once | RESPIRATORY_TRACT | Status: AC
  Administered 2017-01-02: 2 via RESPIRATORY_TRACT
  Filled 2017-01-02: qty 6.7

## 2017-01-02 MED ORDER — ALBUTEROL SULFATE (2.5 MG/3ML) 0.083% IN NEBU
5.0000 mg | INHALATION_SOLUTION | Freq: Once | RESPIRATORY_TRACT | Status: AC
  Administered 2017-01-02: 5 mg via RESPIRATORY_TRACT
  Filled 2017-01-02: qty 6

## 2017-01-02 MED ORDER — DOXYCYCLINE HYCLATE 100 MG PO CAPS: 100.0000 mg | ORAL_CAPSULE | Freq: Two times a day (BID) | ORAL | 0 refills | Status: DC

## 2017-01-02 MED ORDER — DOXYCYCLINE HYCLATE 100 MG PO TABS
100.0000 mg | ORAL_TABLET | Freq: Once | ORAL | Status: AC
  Administered 2017-01-02: 100 mg via ORAL
  Filled 2017-01-02: qty 1

## 2017-01-02 NOTE — ED Provider Notes (Signed)
[facility]-EMERGENCY DEPT Provider Note   CSN: [id number] Arrival date & time: 01/02/17  1435     History   Chief Complaint Chief Complaint  Patient presents with  . Shortness of Breath    HPI [name] [name] is a 83 y.o. male.  Patient c/o episodic coughing for the past few months.  States usually dry, but occasionally brings up small amts of phlegm. Denies hemoptysis. Mild nasal congestion. No sore throat. No fever or chills. No chest pain or discomfort. +smoker. Denies hx asthma or copd. No prior mdi use. Denies leg pain or swelling. No hx dvt or pe.    The history is provided by the patient and a relative.  Shortness of Breath  Associated symptoms include cough. Pertinent negatives include no fever, no headaches, no sore throat, no neck pain, no chest pain, no vomiting, no abdominal pain, no rash and no leg swelling.    Past Medical History:  Diagnosis Date  . Hypertension     Patient Active Problem List   Diagnosis Date Noted  . MIXED HYPERLIPIDEMIA 04/23/2009  . HYPOTHYROIDISM 10/31/2008  . TOBACCO ABUSE 10/31/2008  . HYPERTENSION, BENIGN ESSENTIAL 10/31/2008  . OLD MYOCARDIAL INFARCTION 10/31/2008  . CAD 10/31/2008    History reviewed. No pertinent surgical history.     Home Medications    Prior to Admission medications   Medication Sig Start Date End Date Taking? Authorizing Provider  diazepam (VALIUM) 2 MG tablet Take 1 tablet (2 mg total) by mouth every 8 (eight) hours as needed for anxiety. Patient not taking: Reported on 01/02/2017 06/08/15   Marily Memos, MD  traMADol (ULTRAM) 50 MG tablet Take 1 tablet (50 mg total) by mouth once. Patient not taking: Reported on 01/02/2017 06/08/15   Marily Memos, MD    Family History No family history on file.  Social History Social History  Substance Use Topics  . Smoking status: Light Tobacco Smoker  . Smokeless tobacco: Not on file  . Alcohol use Yes     Allergies   Patient has no known allergies.   Review  of Systems Review of Systems  Constitutional: Negative for chills and fever.  HENT: Positive for congestion. Negative for sore throat.   Eyes: Negative for redness.  Respiratory: Positive for cough and shortness of breath.   Cardiovascular: Negative for chest pain and leg swelling.  Gastrointestinal: Negative for abdominal pain and vomiting.  Genitourinary: Negative for flank pain.  Musculoskeletal: Negative for back pain and neck pain.  Skin: Negative for rash.  Neurological: Negative for headaches.  Hematological: Does not bruise/bleed easily.  Psychiatric/Behavioral: Negative for confusion.     Physical Exam Updated Vital Signs BP 161/92 (BP Location: Right Arm)   Pulse 94   Temp 98.6 F (37 C) (Oral)   Resp 20   Ht 5\' 9"  (1.753 m)   Wt 71.7 kg   SpO2 95%   BMI 23.33 kg/m   Physical Exam  Constitutional: He appears well-developed and well-nourished. No distress.  HENT:  Mouth/Throat: Oropharynx is clear and moist.  Mild nasal congestion  Eyes: Conjunctivae are normal.  Neck: Neck supple. No JVD present. No tracheal deviation present.  Cardiovascular: Normal rate, regular rhythm, normal heart sounds and intact distal pulses.  Exam reveals no gallop and no friction rub.   No murmur heard. Pulmonary/Chest: Effort normal. No accessory muscle usage. No respiratory distress. He has wheezes.  Abdominal: Soft. He exhibits no distension. There is no tenderness.  Musculoskeletal: He exhibits no edema or tenderness.  Neurological: He is alert.  Skin: Skin is warm and dry. No rash noted. He is not diaphoretic.  Psychiatric: He has a normal mood and affect.  Nursing note and vitals reviewed.    ED Treatments / Results  Labs (all labs ordered are listed, but only abnormal results are displayed) Labs Reviewed - No data to display  EKG  EKG Interpretation None       Radiology Dg Chest 2 View  Result Date: 01/02/2017 CLINICAL DATA:  Shortness of Breath EXAM: CHEST  2  VIEW COMPARISON:  06/06/2014 FINDINGS: Cardiac shadow is stable. The lungs are hyper aerated bilaterally. Bibasilar atelectatic changes are seen. No bony abnormality is noted. IMPRESSION: COPD with bibasilar atelectasis. Electronically Signed   By: Alcide CleverMark  Lukens M.D.   On: 01/02/2017 15:27    Procedures Procedures (including critical care time)  Medications Ordered in ED Medications  albuterol (PROVENTIL) (2.5 MG/3ML) 0.083% nebulizer solution 5 mg (not administered)  ipratropium (ATROVENT) nebulizer solution 0.5 mg (not administered)     Initial Impression / Assessment and Plan / ED Course  I have reviewed the triage vital signs and the nursing notes.  Pertinent labs & imaging results that were available during my care of the patient were reviewed by me and considered in my medical decision making (see chart for details).  Wheezing on exam. Albuterol and atrovent neb.  Cxr.  Reviewed nursing notes.  Given prod cough, yell/br phlegm, in many pack yr smoker, w wheezing will rx bronchitis w broncospasm.   Confirmed nkda w pt.  Post albuterol neb in ED, no wheezing.   Patient currently appears stable for d/c.   Rec close primary care follow up.  Return precautions provided.    Final Clinical Impressions(s) / ED Diagnoses   Final diagnoses:  None    New Prescriptions New Prescriptions   No medications on file     Cathren LaineKevin Daizha Anand, MD 01/02/17 [phone]

## 2017-01-02 NOTE — Discharge Instructions (Signed)
It was our pleasure to provide your ER care today - we hope that you feel better.  For your breathing and health, it is very important that you stop smoking.  Take antibiotic (doxycycline) as prescribed.  Use albuterol inhaler as need.   Follow up with primary care doctor in the coming week.  Also follow up with lung specialist in the next 1-2 weeks - see referral - call office tomorrow to arrange appointment.  Return to ER right away if worse, increased trouble breathing, chest pain, other concern.

## 2017-01-02 NOTE — ED Triage Notes (Signed)
Pt c/o shortness of breath with exertion for a few months Pt also has productive cough. Pt smokes right under a pack a day. Denies chest pain.

## 2017-01-28 ENCOUNTER — Other Ambulatory Visit (INDEPENDENT_AMBULATORY_CARE_PROVIDER_SITE_OTHER): Payer: Medicare HMO

## 2017-01-28 ENCOUNTER — Encounter: Payer: Self-pay | Admitting: Pulmonary Disease

## 2017-01-28 ENCOUNTER — Ambulatory Visit (INDEPENDENT_AMBULATORY_CARE_PROVIDER_SITE_OTHER): Payer: Medicare HMO | Admitting: Pulmonary Disease

## 2017-01-28 VITALS — BP 120/62 | HR 88 | Ht 68.0 in | Wt 143.0 lb

## 2017-01-28 DIAGNOSIS — F17210 Nicotine dependence, cigarettes, uncomplicated: Secondary | ICD-10-CM

## 2017-01-28 DIAGNOSIS — R0602 Shortness of breath: Secondary | ICD-10-CM

## 2017-01-28 LAB — CBC WITH DIFFERENTIAL/PLATELET
BASOS ABS: 0.1 10*3/uL (ref 0.0–0.1)
Basophils Relative: 0.9 % (ref 0.0–3.0)
EOS ABS: 0.2 10*3/uL (ref 0.0–0.7)
Eosinophils Relative: 3.2 % (ref 0.0–5.0)
HCT: 45.5 % (ref 39.0–52.0)
Hemoglobin: 14.8 g/dL (ref 13.0–17.0)
Lymphocytes Relative: 25.4 % (ref 12.0–46.0)
Lymphs Abs: 1.9 10*3/uL (ref 0.7–4.0)
MCHC: 32.4 g/dL (ref 30.0–36.0)
MCV: 94 fl (ref 78.0–100.0)
MONO ABS: 0.9 10*3/uL (ref 0.1–1.0)
MONOS PCT: 12.2 % — AB (ref 3.0–12.0)
NEUTROS PCT: 58.3 % (ref 43.0–77.0)
Neutro Abs: 4.3 10*3/uL (ref 1.4–7.7)
PLATELETS: 188 10*3/uL (ref 150.0–400.0)
RBC: 4.84 Mil/uL (ref 4.22–5.81)
RDW: 14.7 % (ref 11.5–15.5)
WBC: 7.4 10*3/uL (ref 4.0–10.5)

## 2017-01-28 LAB — NITRIC OXIDE: NITRIC OXIDE: 65

## 2017-01-28 MED ORDER — NICOTINE 21 MG/24HR TD PT24: 21.0000 mg | MEDICATED_PATCH | TRANSDERMAL | 1 refills | Status: DC

## 2017-01-28 MED ORDER — BUDESONIDE-FORMOTEROL FUMARATE 160-4.5 MCG/ACT IN AERO: 2.0000 | INHALATION_SPRAY | Freq: Two times a day (BID) | RESPIRATORY_TRACT | 0 refills | Status: DC

## 2017-01-28 MED ORDER — BUDESONIDE-FORMOTEROL FUMARATE 160-4.5 MCG/ACT IN AERO: 2.0000 | INHALATION_SPRAY | Freq: Two times a day (BID) | RESPIRATORY_TRACT | 11 refills | Status: DC

## 2017-01-28 NOTE — Addendum Note (Signed)
Addended by: Maxwell Marion A on: 01/28/2017 04:06 PM   Modules accepted: Orders

## 2017-01-28 NOTE — Progress Notes (Signed)
[name] [name]    [id number]    09/11/1933  Primary Care Physician:BLOUNT,ALVIN VINCENT, MD (Inactive)  Referring Physician: No referring provider defined for this encounter.  Chief complaint:   Consult for evaluation of dyspnea HPI: [name] [name] is a 83 year old with extensive smoking history, hypertension, coronary artery disease. He was seen in the ED last month for dyspnea treated with albuterol. He has been referred here for further evaluation. He has symptoms of daily cough, no sputum production or wheeze. ROS is positive for dyspnea on exertion and at rest. He is just on albuterol inhaler which he uses one time a day, never had lung function tests or a pulmonary eval in the past.  He has a 60-pack-year smoking history and continues to smoke 1.5 packs per day. He has occasional seasonal allergies with rhinitis, postnasal drip. He gets more short of breath on exposure to perfume, smoke and does not report any sensitivities to cats, dogs. There is no exposure to mold or any other relevant occupational exposures  Outpatient Encounter Prescriptions as of 01/28/2017  Medication Sig  . albuterol (PROVENTIL HFA;VENTOLIN HFA) 108 (90 Base) MCG/ACT inhaler Inhale 2 puffs into the lungs every 4 (four) hours as needed for wheezing or shortness of breath.  . diazepam (VALIUM) 2 MG tablet Take 1 tablet (2 mg total) by mouth every 8 (eight) hours as needed for anxiety.  . traMADol (ULTRAM) 50 MG tablet Take 1 tablet (50 mg total) by mouth once.  . [DISCONTINUED] doxycycline (VIBRAMYCIN) 100 MG capsule Take 1 capsule (100 mg total) by mouth 2 (two) times daily.   No facility-administered encounter medications on file as of 01/28/2017.     Allergies as of 01/28/2017  . (No Known Allergies)    Past Medical History:  Diagnosis Date  . Heart attack   . Hypertension     No past surgical history on file.  Family History  Problem Relation Age of Onset  . Aneurysm Mother   . Heart failure  Brother     Social History   Social History  . Marital status: Widowed    Spouse name: N/A  . Number of children: N/A  . Years of education: N/A   Occupational History  . Not on file.   Social History Main Topics  . Smoking status: Heavy Tobacco Smoker    Packs/day: 0.50    Years: 70.00  . Smokeless tobacco: Never Used  . Alcohol use No  . Drug use: No  . Sexual activity: Not on file   Other Topics Concern  . Not on file   Social History Narrative  . No narrative on file    Review of systems: Review of Systems  Constitutional: Negative for fever and chills.  HENT: Negative.   Eyes: Negative for blurred vision.  Respiratory: as per HPI  Cardiovascular: Negative for chest pain and palpitations.  Gastrointestinal: Negative for vomiting, diarrhea, blood per rectum. Genitourinary: Negative for dysuria, urgency, frequency and hematuria.  Musculoskeletal: Negative for myalgias, back pain and joint pain.  Skin: Negative for itching and rash.  Neurological: Negative for dizziness, tremors, focal weakness, seizures and loss of consciousness.  Endo/Heme/Allergies: Negative for environmental allergies.  Psychiatric/Behavioral: Negative for depression, suicidal ideas and hallucinations.  All other systems reviewed and are negative.  Physical Exam: Blood pressure 120/62, pulse 88, height  (1.727 m), weight 143 lb (64.9 kg), SpO2 94 %. Gen:      No acute distress HEENT:  EOMI, sclera anicteric Neck:     No masses; no thyromegaly Lungs:    Clear to auscultation bilaterally; normal respiratory effort CV:         Regular rate and rhythm; no murmurs Abd:      + bowel sounds; soft, non-tender; no palpable masses, no distension Ext:    No edema; adequate peripheral perfusion Skin:      Warm and dry; no rash Neuro: alert and oriented x 3 Psych: normal mood and affect  Data Reviewed: FENO 01/28/17- 65  Chest x-ray 12/05/07-hyperinflation, mild basilar atelectasis Chest x-ray  10/10/12-hyperinflation Chest x-ray 06/23/14-hyperinflation Chest x-ray 01/02/17-hyperinflation, bibasilar atelectasis. I have reviewed all images personally.  Assessment:  Consult for evaluation of dyspnea [name] [name] likely has COPD from his extensive smoking history and findings of hyperinflation, emphysema on chest x-ray. He probably has overlap with asthma given his history of allergies, elevated FENO. I'll schedule him for pulmonary function tests, CBC with differential, blood allergy profile and start him on Symbicort 160/4.5.  Review PFTs and evaluate for an ambulatory oxygen levels at next visit.  Active smoker. We discussed smoking cessation and have strongly encouraged him to quit smoking. We will start him on nicotine patches to help him quit. Time spent counseling-5 minutes.  Plan/Recommendations: - PFTs, CBC with diff, blood allergy profile - Start Symbicort 160/4.5, continue albuterol rescue inhaler - Smoking cessation. Prescribe nicotine patches.  Chilton Greathouse MD [facility] Pager [phone] 01/28/2017, 3:07 PM  CC: No ref. provider found

## 2017-01-28 NOTE — Patient Instructions (Signed)
Check CBC with diff and blood allergy profile Start Symbicort 160/4.5 Prescribe nicotine patches 21 mg/day  Follow up in 1-2 months with PFTs

## 2017-01-28 NOTE — Addendum Note (Signed)
Addended by: Maxwell Marion A on: 01/28/2017 05:30 PM   Modules accepted: Orders

## 2017-01-31 LAB — RESPIRATORY ALLERGY PROFILE REGION II ~~LOC~~
Allergen, A. alternata, m6: <0.1 kU/L
Allergen, C. Herbarum, M2: <0.1 kU/L
Allergen, D pternoyssinus,d7: <0.1 kU/L
Allergen, Mulberry, t76: <0.1 kU/L
Allergen, P. notatum, m1: <0.1 kU/L
Aspergillus fumigatus, m3: <0.1 kU/L
Cat Dander: <0.1 kU/L
Cockroach: <0.1 kU/L
Common Ragweed: <0.1 kU/L
D. farinae: <0.1 kU/L
IGE (IMMUNOGLOBULIN E), SERUM: 20 kU/L (ref <115)
Johnson Grass: <0.1 kU/L
Pecan/Hickory Tree IgE: <0.1 kU/L
Rough Pigweed  IgE: <0.1 kU/L
Timothy Grass: <0.1 kU/L

## 2017-04-12 ENCOUNTER — Encounter (HOSPITAL_COMMUNITY): Payer: Self-pay | Admitting: Emergency Medicine

## 2017-04-12 ENCOUNTER — Emergency Department (HOSPITAL_COMMUNITY)
Admission: EM | Admit: 2017-04-12 | Discharge: 2017-04-13 | Disposition: A | Discharge status: Home / Self Care | Payer: Medicare HMO | Attending: Emergency Medicine | Admitting: Emergency Medicine

## 2017-04-12 DIAGNOSIS — Z7951 Long term (current) use of inhaled steroids: Secondary | ICD-10-CM | POA: Insufficient documentation

## 2017-04-12 DIAGNOSIS — R2241 Localized swelling, mass and lump, right lower limb: Secondary | ICD-10-CM | POA: Diagnosis present

## 2017-04-12 DIAGNOSIS — I252 Old myocardial infarction: Secondary | ICD-10-CM | POA: Diagnosis not present

## 2017-04-12 DIAGNOSIS — I10 Essential (primary) hypertension: Secondary | ICD-10-CM | POA: Diagnosis not present

## 2017-04-12 DIAGNOSIS — M7989 Other specified soft tissue disorders: Secondary | ICD-10-CM | POA: Diagnosis not present

## 2017-04-12 DIAGNOSIS — F17200 Nicotine dependence, unspecified, uncomplicated: Secondary | ICD-10-CM | POA: Insufficient documentation

## 2017-04-12 NOTE — ED Triage Notes (Signed)
Pt from home with complaints of right leg swelling x 1 month. Pt denies pain to the area and is not sensitive to palpation. Pt's leg is not red or hot to the touch.

## 2017-04-13 ENCOUNTER — Ambulatory Visit (HOSPITAL_BASED_OUTPATIENT_CLINIC_OR_DEPARTMENT_OTHER)
Admission: RE | Admit: 2017-04-13 | Discharge: 2017-04-13 | Disposition: A | Discharge status: Home / Self Care | Payer: Medicare HMO | Source: Ambulatory Visit | Attending: Emergency Medicine | Admitting: Emergency Medicine

## 2017-04-13 DIAGNOSIS — M7989 Other specified soft tissue disorders: Secondary | ICD-10-CM | POA: Diagnosis not present

## 2017-04-13 LAB — COMPREHENSIVE METABOLIC PANEL
ALBUMIN: 4.2 g/dL (ref 3.5–5.0)
ALK PHOS: 99 U/L (ref 38–126)
ALT: 10 U/L — AB (ref 17–63)
ANION GAP: 9 (ref 5–15)
AST: 22 U/L (ref 15–41)
BILIRUBIN TOTAL: 0.4 mg/dL (ref 0.3–1.2)
BUN: 18 mg/dL (ref 6–20)
CALCIUM: 8.9 mg/dL (ref 8.9–10.3)
CO2: 23 mmol/L (ref 22–32)
CREATININE: 1.15 mg/dL (ref 0.61–1.24)
Chloride: 109 mmol/L (ref 101–111)
GFR calc Af Amer: >60 mL/min (ref >60)
GFR calc non Af Amer: 58 mL/min — ABNORMAL LOW (ref >60)
GLUCOSE: 93 mg/dL (ref 65–99)
Potassium: 3.8 mmol/L (ref 3.5–5.1)
Sodium: 141 mmol/L (ref 135–145)
TOTAL PROTEIN: 7.8 g/dL (ref 6.5–8.1)

## 2017-04-13 LAB — URINALYSIS, ROUTINE W REFLEX MICROSCOPIC
Bilirubin Urine: NEGATIVE
GLUCOSE, UA: NEGATIVE mg/dL
Hgb urine dipstick: NEGATIVE
Ketones, ur: NEGATIVE mg/dL
Nitrite: NEGATIVE
PH: 6 (ref 5.0–8.0)
Protein, ur: NEGATIVE mg/dL
SPECIFIC GRAVITY, URINE: 1.014 (ref 1.005–1.030)

## 2017-04-13 LAB — CBC WITH DIFFERENTIAL/PLATELET
BASOS PCT: 0 %
Basophils Absolute: 0 10*3/uL (ref 0.0–0.1)
Eosinophils Absolute: 0.3 10*3/uL (ref 0.0–0.7)
Eosinophils Relative: 3 %
HCT: 45 % (ref 39.0–52.0)
HEMOGLOBIN: 15.1 g/dL (ref 13.0–17.0)
LYMPHS ABS: 2.6 10*3/uL (ref 0.7–4.0)
Lymphocytes Relative: 30 %
MCH: 30.9 pg (ref 26.0–34.0)
MCHC: 33.6 g/dL (ref 30.0–36.0)
MCV: 92.2 fL (ref 78.0–100.0)
MONOS PCT: 11 %
Monocytes Absolute: 1 10*3/uL (ref 0.1–1.0)
NEUTROS ABS: 5 10*3/uL (ref 1.7–7.7)
NEUTROS PCT: 56 %
Platelets: 158 10*3/uL (ref 150–400)
RBC: 4.88 MIL/uL (ref 4.22–5.81)
RDW: 13.9 % (ref 11.5–15.5)
WBC: 8.9 10*3/uL (ref 4.0–10.5)

## 2017-04-13 MED ORDER — RIVAROXABAN 15 MG PO TABS
15.0000 mg | ORAL_TABLET | Freq: Once | ORAL | Status: AC
  Administered 2017-04-13: 15 mg via ORAL
  Filled 2017-04-13: qty 1

## 2017-04-13 NOTE — Discharge Instructions (Signed)
[facility] at 8:00 AM for your vascular ultrasound. If it shows a blood clot, you will be given a prescription for a blood thinner. If it does not show a blood clot, then you should follow up with the vascular surgeon.

## 2017-04-13 NOTE — ED Provider Notes (Signed)
[facility]-EMERGENCY DEPT Provider Note   CSN: [id number] Arrival date & time: 04/12/17  2026  By signing my name below, I, Rosana Fret, attest that this documentation has been prepared under the direction and in the presence of Dione Booze, MD. Electronically Signed: Rosana Fret, ED Scribe. 04/13/17. 12:19 AM.  History   Chief Complaint Chief Complaint  Patient presents with  . Leg Swelling   The history is provided by the patient. No language interpreter was used.   HPI Comments: [name] [name] is a 83 y.o. male with a PMHx of HTN and CAD, who presents to the Emergency Department complaining of mild, unchanged right leg swelling onset 2 weeks ago. No trouble walking or pain noted. Pt has tried icing the area with no relief of the swelling. No h/o PE/DVT, recent long travel, surgery, fracture, or prolonged immobilization. Pt denies SOB, CP, chest tightness, chest pressure or any other complaints at this time.  Past Medical History:  Diagnosis Date  . Heart attack (HCC)   . Hypertension     Patient Active Problem List   Diagnosis Date Noted  . MIXED HYPERLIPIDEMIA 04/23/2009  . HYPOTHYROIDISM 10/31/2008  . TOBACCO ABUSE 10/31/2008  . HYPERTENSION, BENIGN ESSENTIAL 10/31/2008  . OLD MYOCARDIAL INFARCTION 10/31/2008  . CAD 10/31/2008    History reviewed. No pertinent surgical history.     Home Medications    Prior to Admission medications   Medication Sig Start Date End Date Taking? Authorizing Provider  albuterol (PROVENTIL HFA;VENTOLIN HFA) 108 (90 Base) MCG/ACT inhaler Inhale 2 puffs into the lungs every 4 (four) hours as needed for wheezing or shortness of breath. 01/02/17   Cathren Laine, MD  budesonide-formoterol (SYMBICORT) 160-4.5 MCG/ACT inhaler Inhale 2 puffs into the lungs 2 (two) times daily. 01/28/17 01/29/17  Chilton Greathouse, MD  budesonide-formoterol (SYMBICORT) 160-4.5 MCG/ACT inhaler Inhale 2 puffs into the lungs 2 (two) times daily. 01/28/17 01/29/17  Mannam,  Colbert Coyer, MD  diazepam (VALIUM) 2 MG tablet Take 1 tablet (2 mg total) by mouth every 8 (eight) hours as needed for anxiety. 06/08/15   Mesner, Barbara Cower, MD  nicotine (NICODERM CQ - DOSED IN MG/24 HOURS) 21 mg/24hr patch Place 1 patch (21 mg total) onto the skin daily. 01/28/17 01/28/18  Mannam, Colbert Coyer, MD  traMADol (ULTRAM) 50 MG tablet Take 1 tablet (50 mg total) by mouth once. 06/08/15   Mesner, Barbara Cower, MD    Family History Family History  Problem Relation Age of Onset  . Aneurysm Mother   . Heart failure Brother     Social History Social History  Substance Use Topics  . Smoking status: Heavy Tobacco Smoker    Packs/day: 0.50    Years: 70.00  . Smokeless tobacco: Never Used  . Alcohol use No     Allergies   Patient has no known allergies.   Review of Systems Review of Systems  Respiratory: Negative for chest tightness and shortness of breath.   Cardiovascular: Positive for leg swelling. Negative for chest pain.  Musculoskeletal: Negative for myalgias.  All other systems reviewed and are negative.    Physical Exam Updated Vital Signs BP (!) 187/101 (BP Location: Left Arm)   Pulse 72   Temp 98.9 F (37.2 C) (Oral)   Resp 20   Ht 5\' 9"  (1.753 m)   Wt 149 lb 8 oz (67.8 kg)   SpO2 94%   BMI 22.08 kg/m   Physical Exam  Constitutional: He is oriented to person, place, and time. He appears well-developed and well-nourished.  HENT:  Head: Normocephalic and atraumatic.  Eyes: EOM are normal. Pupils are equal, round, and reactive to light.  Neck: Normal range of motion. Neck supple. No JVD present.  Cardiovascular: Normal rate, regular rhythm and normal heart sounds.   No murmur heard. Pulmonary/Chest: Effort normal and breath sounds normal. He has no wheezes. He has no rales. He exhibits no tenderness.  Abdominal: Soft. Bowel sounds are normal. He exhibits no distension and no mass. There is no tenderness.  Musculoskeletal: Normal range of motion. He exhibits edema. He  exhibits no tenderness.  2 + edema of the right leg. Right calf circumference 4 cm greater than left calf circumference. No swelling of the thigh. No tenderness. Mild veinous stasis changes bilaterally.   Lymphadenopathy:    He has no cervical adenopathy.  Neurological: He is alert and oriented to person, place, and time. No cranial nerve deficit. He exhibits normal muscle tone. Coordination normal.  Skin: Skin is warm and dry. No rash noted.  Psychiatric: He has a normal mood and affect. His behavior is normal. Judgment and thought content normal.  Nursing note and vitals reviewed.    ED Treatments / Results  DIAGNOSTIC STUDIES: Oxygen Saturation is 94% on RA, adequate by my interpretation.   COORDINATION OF CARE: 12:13 AM-Discussed next steps with pt including use of a blood thinner tonight. Pt verbalized understanding and is agreeable with the plan.   Labs (all labs ordered are listed, but only abnormal results are displayed) Labs Reviewed  COMPREHENSIVE METABOLIC PANEL - Abnormal; Notable for the following:       Result Value   ALT 10 (*)    GFR calc non Af Amer 58 (*)    All other components within normal limits  URINALYSIS, ROUTINE W REFLEX MICROSCOPIC - Abnormal; Notable for the following:    Leukocytes, UA TRACE (*)    Bacteria, UA RARE (*)    Squamous Epithelial / LPF 0-5 (*)    All other components within normal limits  CBC WITH DIFFERENTIAL/PLATELET    Procedures Procedures (including critical care time)  Medications Ordered in ED Medications  Rivaroxaban (XARELTO) tablet 15 mg (15 mg Oral Given 04/13/17 0032)     Initial Impression / Assessment and Plan / ED Course  I have reviewed the triage vital signs and the nursing notes.  Pertinent labs & imaging results that were available during my care of the patient were reviewed by me and considered in my medical decision making (see chart for details).  Unilateral leg swelling worrisome for DVT. Screening labs  are obtained and are unremarkable. He is given initial dose of rivaroxaban, and venous Doppler studies have been scheduled for the morning. Old records were reviewed, and he was seen in the ED 3 months ago at which time no edema was present.  Final Clinical Impressions(s) / ED Diagnoses   Final diagnoses:  Right leg swelling    New Prescriptions New Prescriptions   No medications on file   I personally performed the services described in this documentation, which was scribed in my presence. The recorded information has been reviewed and is accurate.       Dione BoozeGlick, Merly Hinkson, MD 04/13/17 [id number]

## 2017-05-23 ENCOUNTER — Ambulatory Visit: Payer: Medicare HMO | Admitting: Pulmonary Disease

## 2017-06-23 ENCOUNTER — Encounter: Payer: Self-pay | Admitting: Pulmonary Disease

## 2017-06-23 ENCOUNTER — Ambulatory Visit (INDEPENDENT_AMBULATORY_CARE_PROVIDER_SITE_OTHER): Payer: Medicare HMO | Admitting: Pulmonary Disease

## 2017-06-23 VITALS — BP 138/80 | HR 82 | Ht 69.0 in | Wt 152.0 lb

## 2017-06-23 DIAGNOSIS — R0602 Shortness of breath: Secondary | ICD-10-CM | POA: Diagnosis not present

## 2017-06-23 DIAGNOSIS — L97919 Non-pressure chronic ulcer of unspecified part of right lower leg with unspecified severity: Secondary | ICD-10-CM

## 2017-06-23 LAB — PULMONARY FUNCTION TEST
DL/VA % PRED: 56 %
DL/VA: 2.48 ml/min/mmHg/L
DLCO UNC: 9.25 ml/min/mmHg
DLCO cor % pred: 32 %
DLCO cor: 9.17 ml/min/mmHg
DLCO unc % pred: 32 %
FEF 25-75 Post: 0.4 L/sec
FEF 25-75 Pre: 0.41 L/sec
FEF2575-%CHANGE-POST: 0 %
FEF2575-%PRED-POST: 23 %
FEF2575-%Pred-Pre: 23 %
FEV1-%Change-Post: -4 %
FEV1-%Pred-Post: 47 %
FEV1-%Pred-Pre: 50 %
FEV1-Post: 1.09 L
FEV1-Pre: 1.14 L
FEV1FVC-%Change-Post: -3 %
FEV1FVC-%Pred-Pre: 73 %
FEV6-%Change-Post: 0 %
FEV6-%PRED-POST: 67 %
FEV6-%PRED-PRE: 67 %
FEV6-PRE: 1.99 L
FEV6-Post: 2.01 L
FEV6FVC-%CHANGE-POST: 1 %
FEV6FVC-%PRED-PRE: 101 %
FEV6FVC-%Pred-Post: 103 %
FVC-%CHANGE-POST: 0 %
FVC-%Pred-Post: 65 %
FVC-%Pred-Pre: 66 %
FVC-PRE: 2.1 L
FVC-Post: 2.09 L
POST FEV1/FVC RATIO: 52 %
POST FEV6/FVC RATIO: 96 %
PRE FEV6/FVC RATIO: 95 %
Pre FEV1/FVC ratio: 54 %
RV % PRED: 124 %
RV: 3.14 L
TLC % pred: 91 %
TLC: 5.93 L

## 2017-06-23 NOTE — Progress Notes (Signed)
PFT done today. 

## 2017-06-23 NOTE — Progress Notes (Signed)
[name] [name]    [id number]    05/26/1933  Primary Care Physician:Blount, Viviana SimplerAlviTessa Lernern Vincent, MD (Inactive)  Referring Physician: Chilton GreathouseMannam, Lakendrick Paradis, MD [address] [place], [place] [id number]  Chief complaint:   Follow up for COPD HPI: Mr. [name] [name] is a 83 year old with extensive smoking history, hypertension, coronary artery disease. He was seen in the ED last month for dyspnea treated with albuterol. He has been referred here for further evaluation. He has symptoms of daily cough, no sputum production or wheeze. ROS is positive for dyspnea on exertion and at rest. He is just on albuterol inhaler which he uses one time a day, never had lung function tests or a pulmonary eval in the past.  He has a 60-pack-year smoking history and continues to smoke 1.5 packs per day. He has occasional seasonal allergies with rhinitis, postnasal drip. He gets more short of breath on exposure to perfume, smoke and does not report any sensitivities to cats, dogs. There is no exposure to mold or any other relevant occupational exposures.  Interim history: He is using the Symbicort only once daily. He reports very minimal change in symptoms. He had PFTs and is here to review the results. Complains of unilateral right leg swelling. He has some chronic ulceration on lt leg with clear discharge. He has been evaluated with a lower extremity Doppler which did not show any DVT.   Outpatient Encounter Prescriptions as of 06/23/2017  Medication Sig  . albuterol (PROVENTIL HFA;VENTOLIN HFA) 108 (90 Base) MCG/ACT inhaler Inhale 2 puffs into the lungs every 4 (four) hours as needed for wheezing or shortness of breath.  . budesonide-formoterol (SYMBICORT) 160-4.5 MCG/ACT inhaler Inhale 2 puffs into the lungs 2 (two) times daily.   No facility-administered encounter medications on file as of 06/23/2017.     Allergies as of 06/23/2017  . (No Known Allergies)    Past Medical History:  Diagnosis Date  . Heart  attack (HCC)   . Hypertension     No past surgical history on file.  Family History  Problem Relation Age of Onset  . Aneurysm Mother   . Heart failure Brother     Social History   Social History  . Marital status: Widowed    Spouse name: N/A  . Number of children: N/A  . Years of education: N/A   Occupational History  . Not on file.   Social History Main Topics  . Smoking status: Heavy Tobacco Smoker    Packs/day: 0.50    Years: 70.00  . Smokeless tobacco: Never Used  . Alcohol use No  . Drug use: No  . Sexual activity: Not on file   Other Topics Concern  . Not on file   Social History Narrative  . No narrative on file    Review of systems: Review of Systems  Constitutional: Negative for fever and chills.  HENT: Negative.   Eyes: Negative for blurred vision.  Respiratory: as per HPI  Cardiovascular: Negative for chest pain and palpitations.  Gastrointestinal: Negative for vomiting, diarrhea, blood per rectum. Genitourinary: Negative for dysuria, urgency, frequency and hematuria.  Musculoskeletal: Negative for myalgias, back pain and joint pain.  Skin: Negative for itching and rash.  Neurological: Negative for dizziness, tremors, focal weakness, seizures and loss of consciousness.  Endo/Heme/Allergies: Negative for environmental allergies.  Psychiatric/Behavioral: Negative for depression, suicidal ideas and hallucinations.  All other systems reviewed and are negative.  Physical Exam: Blood pressure 138/80, pulse  82, height 5\' 9"  (1.753 m), weight 152 lb (68.9 kg), SpO2 92 %. Gen:      No acute distress HEENT:  EOMI, sclera anicteric Neck:     No masses; no thyromegaly Lungs:    Clear to auscultation bilaterally; normal respiratory effort CV:         Regular rate and rhythm; no murmurs Abd:      + bowel sounds; soft, non-tender; no palpable masses, no distension Ext:    No edema; adequate peripheral perfusion Skin:      Warm and dry; no rash Neuro:  alert and oriented x 3 Psych: normal mood and affect  Data Reviewed: FENO 01/28/17- 65  Chest x-ray 12/05/07-hyperinflation, mild basilar atelectasis Chest x-ray 10/10/12-hyperinflation Chest x-ray 06/23/14-hyperinflation Chest x-ray 01/02/17-hyperinflation, bibasilar atelectasis. I have reviewed all images personally.  PFTs 8/30/1 8 FVC 2.09 (5%) FEV1 1.09 (47%] F/F 52 TLC 91% RV/TLC 136% DLCO 32% Severe obstructive lung disease with air-trapping, severe diffusion impairment.  LE duplex 04/13/17- No DVT, large amount of interstitial fluid.  CBC 6/20-WBC 8.9, 3% eos, Absolute eos count 300 Blood allergy profile. 01/28/17- negative, IgE 20  Assessment:  Severe COPD PFTs reviewed with him. There is show severe obstruction with air trapping and diffusion impairment. As he has elevated FENO and no mild peripheral eosinophilia he will benefit from LABA/ICS combination. He is not taking Symbicort on a regular basis. I encouraged him to use 2 puffs twice daily  Evaluate for an ambulatory oxygen levels at next visit.  Active smoker. We discussed smoking cessation and have strongly encouraged him to quit smoking. We will start him on nicotine patches to help him quit. Time spent counseling-5 minutes.  Rt LE swelling No evidence of DVT. He likely has peripheral vascular disease from smoking. I'll send him to vascular surgery for further evaluation.  Plan/Recommendations: - Symbicort 160/4.5, continue albuterol rescue inhaler - Smoking cessation. Prescribe nicotine patches. - Referral to vascular surgery  Chilton Greathouse MD [facility] Pager [phone] 06/23/2017, 1:32 PM  CC: Chilton Greathouse, MD

## 2017-06-23 NOTE — Patient Instructions (Addendum)
We will make a referral to Dr. Arbie CookeyEarly vascular and vein specialist to evaluate your leg ulcer. I suspect you may have problems with circulation to your legs. We will give a handicapped placard Please use the Symbicort as directed. You needs to use 2 puffs twice daily. Please use over-the-counter nicotine patches to help with smoking cessation  Return to clinic in 6 months.

## 2017-06-28 ENCOUNTER — Other Ambulatory Visit: Payer: Self-pay

## 2017-06-28 DIAGNOSIS — I83229 Varicose veins of left lower extremity with both ulcer of unspecified site and inflammation: Principal | ICD-10-CM

## 2017-06-28 DIAGNOSIS — L97929 Non-pressure chronic ulcer of unspecified part of left lower leg with unspecified severity: Principal | ICD-10-CM

## 2017-06-28 DIAGNOSIS — I83219 Varicose veins of right lower extremity with both ulcer of unspecified site and inflammation: Secondary | ICD-10-CM

## 2017-06-28 DIAGNOSIS — L97919 Non-pressure chronic ulcer of unspecified part of right lower leg with unspecified severity: Principal | ICD-10-CM

## 2017-08-09 ENCOUNTER — Encounter: Payer: Medicare HMO | Admitting: Vascular Surgery

## 2017-08-09 ENCOUNTER — Inpatient Hospital Stay (HOSPITAL_COMMUNITY): Admission: RE | Admit: 2017-08-09 | Payer: Medicare HMO | Source: Ambulatory Visit

## 2017-11-02 DIAGNOSIS — R000 Tachycardia, unspecified: Secondary | ICD-10-CM | POA: Diagnosis not present

## 2017-11-02 DIAGNOSIS — I509 Heart failure, unspecified: Secondary | ICD-10-CM | POA: Diagnosis not present

## 2017-11-02 DIAGNOSIS — I2510 Atherosclerotic heart disease of native coronary artery without angina pectoris: Secondary | ICD-10-CM | POA: Diagnosis not present

## 2017-11-02 DIAGNOSIS — R0602 Shortness of breath: Secondary | ICD-10-CM | POA: Diagnosis not present

## 2017-11-02 DIAGNOSIS — R600 Localized edema: Secondary | ICD-10-CM | POA: Diagnosis not present

## 2017-11-04 DIAGNOSIS — R600 Localized edema: Secondary | ICD-10-CM | POA: Diagnosis not present

## 2017-11-04 DIAGNOSIS — I2510 Atherosclerotic heart disease of native coronary artery without angina pectoris: Secondary | ICD-10-CM | POA: Diagnosis not present

## 2017-11-04 DIAGNOSIS — R0602 Shortness of breath: Secondary | ICD-10-CM | POA: Diagnosis not present

## 2017-11-04 DIAGNOSIS — I509 Heart failure, unspecified: Secondary | ICD-10-CM | POA: Diagnosis not present

## 2017-11-06 ENCOUNTER — Encounter (HOSPITAL_COMMUNITY): Payer: Self-pay | Admitting: *Deleted

## 2017-11-06 ENCOUNTER — Inpatient Hospital Stay (HOSPITAL_COMMUNITY): Payer: Medicare HMO

## 2017-11-06 ENCOUNTER — Inpatient Hospital Stay (HOSPITAL_COMMUNITY)
Admission: EM | Admit: 2017-11-06 | Discharge: 2017-11-17 | DRG: 280 | Disposition: A | Discharge status: Hospice | Payer: Medicare HMO | Attending: Nephrology | Admitting: Nephrology

## 2017-11-06 ENCOUNTER — Emergency Department (HOSPITAL_COMMUNITY): Payer: Medicare HMO

## 2017-11-06 DIAGNOSIS — I10 Essential (primary) hypertension: Secondary | ICD-10-CM | POA: Diagnosis not present

## 2017-11-06 DIAGNOSIS — R531 Weakness: Secondary | ICD-10-CM

## 2017-11-06 DIAGNOSIS — N182 Chronic kidney disease, stage 2 (mild): Secondary | ICD-10-CM

## 2017-11-06 DIAGNOSIS — Z8249 Family history of ischemic heart disease and other diseases of the circulatory system: Secondary | ICD-10-CM

## 2017-11-06 DIAGNOSIS — I4892 Unspecified atrial flutter: Secondary | ICD-10-CM | POA: Diagnosis present

## 2017-11-06 DIAGNOSIS — I5031 Acute diastolic (congestive) heart failure: Secondary | ICD-10-CM | POA: Diagnosis present

## 2017-11-06 DIAGNOSIS — N179 Acute kidney failure, unspecified: Secondary | ICD-10-CM | POA: Diagnosis present

## 2017-11-06 DIAGNOSIS — I130 Hypertensive heart and chronic kidney disease with heart failure and stage 1 through stage 4 chronic kidney disease, or unspecified chronic kidney disease: Principal | ICD-10-CM | POA: Diagnosis present

## 2017-11-06 DIAGNOSIS — E872 Acidosis, unspecified: Secondary | ICD-10-CM | POA: Diagnosis present

## 2017-11-06 DIAGNOSIS — I21A1 Myocardial infarction type 2: Secondary | ICD-10-CM | POA: Diagnosis present

## 2017-11-06 DIAGNOSIS — R296 Repeated falls: Secondary | ICD-10-CM | POA: Diagnosis present

## 2017-11-06 DIAGNOSIS — A530 Latent syphilis, unspecified as early or late: Secondary | ICD-10-CM | POA: Diagnosis not present

## 2017-11-06 DIAGNOSIS — R570 Cardiogenic shock: Secondary | ICD-10-CM | POA: Diagnosis present

## 2017-11-06 DIAGNOSIS — Z9119 Patient's noncompliance with other medical treatment and regimen: Secondary | ICD-10-CM

## 2017-11-06 DIAGNOSIS — I5023 Acute on chronic systolic (congestive) heart failure: Secondary | ICD-10-CM | POA: Diagnosis not present

## 2017-11-06 DIAGNOSIS — I5043 Acute on chronic combined systolic (congestive) and diastolic (congestive) heart failure: Secondary | ICD-10-CM | POA: Diagnosis present

## 2017-11-06 DIAGNOSIS — N183 Chronic kidney disease, stage 3 unspecified: Secondary | ICD-10-CM

## 2017-11-06 DIAGNOSIS — I5041 Acute combined systolic (congestive) and diastolic (congestive) heart failure: Secondary | ICD-10-CM | POA: Diagnosis not present

## 2017-11-06 DIAGNOSIS — Z515 Encounter for palliative care: Secondary | ICD-10-CM

## 2017-11-06 DIAGNOSIS — R05 Cough: Secondary | ICD-10-CM | POA: Diagnosis present

## 2017-11-06 DIAGNOSIS — I110 Hypertensive heart disease with heart failure: Secondary | ICD-10-CM | POA: Diagnosis not present

## 2017-11-06 DIAGNOSIS — R404 Transient alteration of awareness: Secondary | ICD-10-CM | POA: Diagnosis not present

## 2017-11-06 DIAGNOSIS — I4581 Long QT syndrome: Secondary | ICD-10-CM | POA: Diagnosis present

## 2017-11-06 DIAGNOSIS — N170 Acute kidney failure with tubular necrosis: Secondary | ICD-10-CM | POA: Diagnosis not present

## 2017-11-06 DIAGNOSIS — R0602 Shortness of breath: Secondary | ICD-10-CM | POA: Diagnosis not present

## 2017-11-06 DIAGNOSIS — R4182 Altered mental status, unspecified: Secondary | ICD-10-CM | POA: Diagnosis not present

## 2017-11-06 DIAGNOSIS — J9811 Atelectasis: Secondary | ICD-10-CM | POA: Diagnosis not present

## 2017-11-06 DIAGNOSIS — Z681 Body mass index (BMI) 19 or less, adult: Secondary | ICD-10-CM

## 2017-11-06 DIAGNOSIS — R443 Hallucinations, unspecified: Secondary | ICD-10-CM | POA: Diagnosis present

## 2017-11-06 DIAGNOSIS — R945 Abnormal results of liver function studies: Secondary | ICD-10-CM | POA: Diagnosis present

## 2017-11-06 DIAGNOSIS — J9621 Acute and chronic respiratory failure with hypoxia: Secondary | ICD-10-CM | POA: Diagnosis not present

## 2017-11-06 DIAGNOSIS — E43 Unspecified severe protein-calorie malnutrition: Secondary | ICD-10-CM | POA: Diagnosis present

## 2017-11-06 DIAGNOSIS — I480 Paroxysmal atrial fibrillation: Secondary | ICD-10-CM

## 2017-11-06 DIAGNOSIS — J9690 Respiratory failure, unspecified, unspecified whether with hypoxia or hypercapnia: Secondary | ICD-10-CM | POA: Diagnosis not present

## 2017-11-06 DIAGNOSIS — E039 Hypothyroidism, unspecified: Secondary | ICD-10-CM | POA: Diagnosis not present

## 2017-11-06 DIAGNOSIS — R748 Abnormal levels of other serum enzymes: Secondary | ICD-10-CM | POA: Diagnosis not present

## 2017-11-06 DIAGNOSIS — I2510 Atherosclerotic heart disease of native coronary artery without angina pectoris: Secondary | ICD-10-CM | POA: Diagnosis not present

## 2017-11-06 DIAGNOSIS — R410 Disorientation, unspecified: Secondary | ICD-10-CM | POA: Diagnosis not present

## 2017-11-06 DIAGNOSIS — K922 Gastrointestinal hemorrhage, unspecified: Secondary | ICD-10-CM | POA: Diagnosis present

## 2017-11-06 DIAGNOSIS — I471 Supraventricular tachycardia: Secondary | ICD-10-CM | POA: Diagnosis present

## 2017-11-06 DIAGNOSIS — R778 Other specified abnormalities of plasma proteins: Secondary | ICD-10-CM | POA: Diagnosis present

## 2017-11-06 DIAGNOSIS — R197 Diarrhea, unspecified: Secondary | ICD-10-CM | POA: Diagnosis present

## 2017-11-06 DIAGNOSIS — I5033 Acute on chronic diastolic (congestive) heart failure: Secondary | ICD-10-CM

## 2017-11-06 DIAGNOSIS — I129 Hypertensive chronic kidney disease with stage 1 through stage 4 chronic kidney disease, or unspecified chronic kidney disease: Secondary | ICD-10-CM | POA: Diagnosis not present

## 2017-11-06 DIAGNOSIS — I509 Heart failure, unspecified: Secondary | ICD-10-CM | POA: Diagnosis not present

## 2017-11-06 DIAGNOSIS — R7989 Other specified abnormal findings of blood chemistry: Secondary | ICD-10-CM | POA: Diagnosis present

## 2017-11-06 DIAGNOSIS — J9601 Acute respiratory failure with hypoxia: Secondary | ICD-10-CM | POA: Diagnosis present

## 2017-11-06 DIAGNOSIS — I252 Old myocardial infarction: Secondary | ICD-10-CM | POA: Diagnosis not present

## 2017-11-06 DIAGNOSIS — I25110 Atherosclerotic heart disease of native coronary artery with unstable angina pectoris: Secondary | ICD-10-CM | POA: Diagnosis not present

## 2017-11-06 DIAGNOSIS — I2609 Other pulmonary embolism with acute cor pulmonale: Secondary | ICD-10-CM | POA: Diagnosis not present

## 2017-11-06 DIAGNOSIS — W19XXXA Unspecified fall, initial encounter: Secondary | ICD-10-CM | POA: Diagnosis present

## 2017-11-06 DIAGNOSIS — F17200 Nicotine dependence, unspecified, uncomplicated: Secondary | ICD-10-CM | POA: Diagnosis not present

## 2017-11-06 DIAGNOSIS — G9341 Metabolic encephalopathy: Secondary | ICD-10-CM | POA: Diagnosis present

## 2017-11-06 DIAGNOSIS — E162 Hypoglycemia, unspecified: Secondary | ICD-10-CM | POA: Diagnosis present

## 2017-11-06 DIAGNOSIS — Z955 Presence of coronary angioplasty implant and graft: Secondary | ICD-10-CM

## 2017-11-06 DIAGNOSIS — Z66 Do not resuscitate: Secondary | ICD-10-CM | POA: Diagnosis present

## 2017-11-06 DIAGNOSIS — Z7189 Other specified counseling: Secondary | ICD-10-CM | POA: Diagnosis not present

## 2017-11-06 DIAGNOSIS — R269 Unspecified abnormalities of gait and mobility: Secondary | ICD-10-CM | POA: Diagnosis not present

## 2017-11-06 DIAGNOSIS — I255 Ischemic cardiomyopathy: Secondary | ICD-10-CM | POA: Diagnosis present

## 2017-11-06 DIAGNOSIS — R768 Other specified abnormal immunological findings in serum: Secondary | ICD-10-CM | POA: Diagnosis not present

## 2017-11-06 DIAGNOSIS — J690 Pneumonitis due to inhalation of food and vomit: Secondary | ICD-10-CM | POA: Diagnosis not present

## 2017-11-06 DIAGNOSIS — F17210 Nicotine dependence, cigarettes, uncomplicated: Secondary | ICD-10-CM | POA: Diagnosis not present

## 2017-11-06 DIAGNOSIS — K761 Chronic passive congestion of liver: Secondary | ICD-10-CM | POA: Diagnosis present

## 2017-11-06 DIAGNOSIS — Z960 Presence of urogenital implants: Secondary | ICD-10-CM | POA: Diagnosis not present

## 2017-11-06 HISTORY — DX: Chronic kidney disease, stage 2 (mild): N18.2

## 2017-11-06 HISTORY — DX: Heart failure, unspecified: I50.9

## 2017-11-06 LAB — CBC
HCT: 47.1 % (ref 39.0–52.0)
Hemoglobin: 15.6 g/dL (ref 13.0–17.0)
MCH: 32 pg (ref 26.0–34.0)
MCHC: 33.1 g/dL (ref 30.0–36.0)
MCV: 96.5 fL (ref 78.0–100.0)
Platelets: 136 10*3/uL — ABNORMAL LOW (ref 150–400)
RBC: 4.88 MIL/uL (ref 4.22–5.81)
RDW: 15.8 % — AB (ref 11.5–15.5)
WBC: 11.1 10*3/uL — ABNORMAL HIGH (ref 4.0–10.5)

## 2017-11-06 LAB — URINALYSIS, ROUTINE W REFLEX MICROSCOPIC
BILIRUBIN URINE: NEGATIVE
Glucose, UA: NEGATIVE mg/dL
HGB URINE DIPSTICK: NEGATIVE
Ketones, ur: NEGATIVE mg/dL
Leukocytes, UA: NEGATIVE
NITRITE: NEGATIVE
PROTEIN: NEGATIVE mg/dL
SPECIFIC GRAVITY, URINE: 1.009 (ref 1.005–1.030)
pH: 5 (ref 5.0–8.0)

## 2017-11-06 LAB — COMPREHENSIVE METABOLIC PANEL
ALT: 59 U/L (ref 17–63)
AST: 78 U/L — AB (ref 15–41)
Albumin: 3.6 g/dL (ref 3.5–5.0)
Alkaline Phosphatase: 110 U/L (ref 38–126)
Anion gap: 11 (ref 5–15)
BUN: 42 mg/dL — ABNORMAL HIGH (ref 6–20)
CO2: 25 mmol/L (ref 22–32)
Calcium: 9 mg/dL (ref 8.9–10.3)
Chloride: 108 mmol/L (ref 101–111)
Creatinine, Ser: 1.76 mg/dL — ABNORMAL HIGH (ref 0.61–1.24)
GFR, EST AFRICAN AMERICAN: 40 mL/min — AB (ref >60)
GFR, EST NON AFRICAN AMERICAN: 34 mL/min — AB (ref >60)
Glucose, Bld: 123 mg/dL — ABNORMAL HIGH (ref 65–99)
POTASSIUM: 4.8 mmol/L (ref 3.5–5.1)
SODIUM: 144 mmol/L (ref 135–145)
Total Bilirubin: 1.1 mg/dL (ref 0.3–1.2)
Total Protein: 6 g/dL — ABNORMAL LOW (ref 6.5–8.1)

## 2017-11-06 LAB — PROCALCITONIN: PROCALCITONIN: 0.1 ng/mL

## 2017-11-06 LAB — I-STAT CG4 LACTIC ACID, ED: LACTIC ACID, VENOUS: 2.99 mmol/L — AB (ref 0.5–1.9)

## 2017-11-06 LAB — VITAMIN B12: VITAMIN B 12: 931 pg/mL — AB (ref 180–914)

## 2017-11-06 LAB — AMMONIA: AMMONIA: 22 umol/L (ref 9–35)

## 2017-11-06 LAB — PROTIME-INR
INR: 1.21
PROTHROMBIN TIME: 15.2 s (ref 11.4–15.2)

## 2017-11-06 LAB — LACTIC ACID, PLASMA
LACTIC ACID, VENOUS: 2.5 mmol/L — AB (ref 0.5–1.9)
Lactic Acid, Venous: 2.3 mmol/L (ref 0.5–1.9)

## 2017-11-06 LAB — I-STAT TROPONIN, ED: TROPONIN I, POC: 0.31 ng/mL — AB (ref 0.00–0.08)

## 2017-11-06 LAB — TROPONIN I: TROPONIN I: 0.29 ng/mL — AB (ref <0.03)

## 2017-11-06 LAB — BRAIN NATRIURETIC PEPTIDE: B NATRIURETIC PEPTIDE 5: 2055.2 pg/mL — AB (ref 0.0–100.0)

## 2017-11-06 MED ORDER — MORPHINE SULFATE (PF) 4 MG/ML IV SOLN: 2.0000 mg | INTRAVENOUS | Status: DC | PRN

## 2017-11-06 MED ORDER — NITROGLYCERIN 0.4 MG SL SUBL: 0.4000 mg | SUBLINGUAL_TABLET | SUBLINGUAL | Status: DC | PRN

## 2017-11-06 MED ORDER — HYDRALAZINE HCL 20 MG/ML IJ SOLN: 5.0000 mg | INTRAMUSCULAR | Status: DC | PRN

## 2017-11-06 MED ORDER — ONDANSETRON HCL 4 MG/2ML IJ SOLN: 4.0000 mg | Freq: Four times a day (QID) | INTRAMUSCULAR | Status: DC | PRN

## 2017-11-06 MED ORDER — ZOLPIDEM TARTRATE 5 MG PO TABS: 5.0000 mg | ORAL_TABLET | Freq: Every evening | ORAL | Status: DC | PRN

## 2017-11-06 MED ORDER — NICOTINE 21 MG/24HR TD PT24
21.0000 mg | MEDICATED_PATCH | Freq: Every day | TRANSDERMAL | Status: DC
  Administered 2017-11-06 – 2017-11-15 (×9): 21 mg via TRANSDERMAL
  Filled 2017-11-06 (×9): qty 1

## 2017-11-06 MED ORDER — IPRATROPIUM BROMIDE 0.02 % IN SOLN
0.5000 mg | RESPIRATORY_TRACT | Status: DC
  Filled 2017-11-06: qty 2.5

## 2017-11-06 MED ORDER — SODIUM CHLORIDE 0.9% FLUSH
3.0000 mL | Freq: Two times a day (BID) | INTRAVENOUS | Status: DC
  Administered 2017-11-06 – 2017-11-09 (×3): 3 mL via INTRAVENOUS
  Administered 2017-11-12: 10 mL via INTRAVENOUS
  Administered 2017-11-13 – 2017-11-17 (×8): 3 mL via INTRAVENOUS

## 2017-11-06 MED ORDER — ALPRAZOLAM 0.25 MG PO TABS: 0.2500 mg | ORAL_TABLET | Freq: Two times a day (BID) | ORAL | Status: DC | PRN

## 2017-11-06 MED ORDER — FUROSEMIDE 10 MG/ML IJ SOLN
40.0000 mg | Freq: Once | INTRAMUSCULAR | Status: AC
  Administered 2017-11-06: 40 mg via INTRAVENOUS
  Filled 2017-11-06: qty 4

## 2017-11-06 MED ORDER — IPRATROPIUM-ALBUTEROL 0.5-2.5 (3) MG/3ML IN SOLN
3.0000 mL | Freq: Three times a day (TID) | RESPIRATORY_TRACT | Status: DC
  Administered 2017-11-07: 3 mL via RESPIRATORY_TRACT
  Filled 2017-11-06: qty 3

## 2017-11-06 MED ORDER — DM-GUAIFENESIN ER 30-600 MG PO TB12: 1.0000 | ORAL_TABLET | Freq: Two times a day (BID) | ORAL | Status: DC | PRN

## 2017-11-06 MED ORDER — ENOXAPARIN SODIUM 40 MG/0.4ML ~~LOC~~ SOLN
40.0000 mg | SUBCUTANEOUS | Status: DC
  Administered 2017-11-06: 40 mg via SUBCUTANEOUS
  Filled 2017-11-06: qty 0.4

## 2017-11-06 MED ORDER — SODIUM CHLORIDE 0.9% FLUSH: 3.0000 mL | INTRAVENOUS | Status: DC | PRN

## 2017-11-06 MED ORDER — ASPIRIN 81 MG PO CHEW
324.0000 mg | CHEWABLE_TABLET | Freq: Once | ORAL | Status: AC
  Administered 2017-11-06: 324 mg via ORAL
  Filled 2017-11-06: qty 4

## 2017-11-06 MED ORDER — ASPIRIN 81 MG PO CHEW
324.0000 mg | CHEWABLE_TABLET | Freq: Every day | ORAL | Status: DC
  Administered 2017-11-07 – 2017-11-15 (×8): 324 mg via ORAL
  Filled 2017-11-06 (×8): qty 4

## 2017-11-06 MED ORDER — ATORVASTATIN CALCIUM 40 MG PO TABS
40.0000 mg | ORAL_TABLET | Freq: Every day | ORAL | Status: DC
  Administered 2017-11-06 – 2017-11-14 (×9): 40 mg via ORAL
  Filled 2017-11-06 (×9): qty 1

## 2017-11-06 MED ORDER — METOPROLOL TARTRATE 25 MG PO TABS
25.0000 mg | ORAL_TABLET | Freq: Two times a day (BID) | ORAL | Status: DC
  Administered 2017-11-07 – 2017-11-08 (×3): 25 mg via ORAL
  Filled 2017-11-06 (×4): qty 1

## 2017-11-06 MED ORDER — MORPHINE SULFATE (PF) 4 MG/ML IV SOLN
1.0000 mg | INTRAVENOUS | Status: DC | PRN
Start: 2017-11-06 — End: 2017-11-15

## 2017-11-06 MED ORDER — SODIUM CHLORIDE 0.9 % IV SOLN: 250.0000 mL | INTRAVENOUS | Status: DC | PRN

## 2017-11-06 MED ORDER — ACETAMINOPHEN 325 MG PO TABS: 650.0000 mg | ORAL_TABLET | ORAL | Status: DC | PRN

## 2017-11-06 MED ORDER — ALBUTEROL SULFATE (2.5 MG/3ML) 0.083% IN NEBU: 2.5000 mg | INHALATION_SOLUTION | RESPIRATORY_TRACT | Status: DC | PRN

## 2017-11-06 MED ORDER — METOPROLOL TARTRATE 25 MG PO TABS: 25.0000 mg | ORAL_TABLET | Freq: Two times a day (BID) | ORAL | Status: DC

## 2017-11-06 NOTE — ED Notes (Signed)
Patient transported to X-ray 

## 2017-11-06 NOTE — ED Provider Notes (Signed)
[facility] Provider Note   CSN: [id number] Arrival date & time: 11/06/17  1537     History   Chief Complaint Chief Complaint  Patient presents with  . Weakness    HPI [name] [name] is a 84 y.o. male.  HPI [name] [name] is a 84 y.o. male with prior reported MI, hypertension, CHF, presents to emergency department with complaint of altered mental status.  Patient is coming from home.  Patient's family at bedside, state that patient has been confused and weak for the last 24 hours.  They state that he has been "talking out of his head and seeing things that are not there."  Patient recently diagnosed with heart failure, and started on Lasix, metoprolol, potassium.  Patient states he takes his own medications and does not think he missed any doses or took extra.  He denies drugs or alcohol.  He denies falling or hitting his head.  He denies any pain anywhere his body.  He does report some weakness and dizziness, however denies any chest pain, abdominal pain, headache.  Patient states he has had decreased appetite.  He also reports chronic cough which she has had for months.  No recent fever, chills, URI symptoms.  No urinary symptoms.  Past Medical History:  Diagnosis Date  . CHF (congestive heart failure) (HCC)   . Heart attack (HCC)   . Hypertension     Patient Active Problem List   Diagnosis Date Noted  . MIXED HYPERLIPIDEMIA 04/23/2009  . HYPOTHYROIDISM 10/31/2008  . TOBACCO ABUSE 10/31/2008  . HYPERTENSION, BENIGN ESSENTIAL 10/31/2008  . OLD MYOCARDIAL INFARCTION 10/31/2008  . CAD 10/31/2008    History reviewed. No pertinent surgical history.     Home Medications    Prior to Admission medications   Medication Sig Start Date End Date Taking? Authorizing Provider  furosemide (LASIX) 20 MG tablet Take 20 mg by mouth daily. 11/02/17  Yes [provider]  metoprolol tartrate (LOPRESSOR) 25 MG tablet Take 25 mg by mouth 2 (two)  times daily. 11/02/17  Yes [provider]  potassium chloride (K-DUR) 10 MEQ tablet Take 10 mEq by mouth 2 (two) times daily. 11/02/17  Yes [provider]  albuterol (PROVENTIL HFA;VENTOLIN HFA) 108 (90 Base) MCG/ACT inhaler Inhale 2 puffs into the lungs every 4 (four) hours as needed for wheezing or shortness of breath. 01/02/17   Cathren LaineSteinl, Kevin, MD  budesonide-formoterol (SYMBICORT) 160-4.5 MCG/ACT inhaler Inhale 2 puffs into the lungs 2 (two) times daily. 01/28/17 04/13/17  Chilton GreathouseMannam, Praveen, MD    Family History Family History  Problem Relation Age of Onset  . Aneurysm Mother   . Heart failure Brother     Social History Social History   Tobacco Use  . Smoking status: Heavy Tobacco Smoker    Packs/day: 0.50    Years: 70.00    Pack years: 35.00  . Smokeless tobacco: Never Used  Substance Use Topics  . Alcohol use: No  . Drug use: No     Allergies   Patient has no known allergies.   Review of Systems Review of Systems  Constitutional: Positive for fatigue. Negative for chills and fever.  Respiratory: Negative for cough, chest tightness and shortness of breath.   Cardiovascular: Negative for chest pain, palpitations and leg swelling.  Gastrointestinal: Negative for abdominal distention, abdominal pain, diarrhea, nausea and vomiting.  Genitourinary: Negative for dysuria, frequency, hematuria and urgency.  Musculoskeletal: Negative for arthralgias, myalgias, neck pain and neck stiffness.  Skin:  Negative for rash.  Allergic/Immunologic: Negative for immunocompromised state.  Neurological: Positive for dizziness, weakness and light-headedness. Negative for numbness and headaches.  Psychiatric/Behavioral: Positive for confusion and hallucinations.  All other systems reviewed and are negative.    Physical Exam Updated Vital Signs BP 109/84   Pulse 72   Temp 98.2 F (36.8 C) (Oral)   Resp 19   SpO2 (!) 84%   Physical Exam  Constitutional: He is oriented to  person, place, and time. He appears well-developed and well-nourished. No distress.  HENT:  Head: Normocephalic and atraumatic.  Eyes: Conjunctivae and EOM are normal. Pupils are equal, round, and reactive to light.  Neck: Normal range of motion. Neck supple.  Cardiovascular: Normal rate, regular rhythm and normal heart sounds.  Pulmonary/Chest: No respiratory distress. He has rales.  Decreased air movement bilaterally, rales at bases  Abdominal: Soft. Bowel sounds are normal. He exhibits no distension. There is no tenderness. There is no rebound.  Musculoskeletal: He exhibits edema.  3+ pitting edema to bilateral LE up to the groin  Neurological: He is alert and oriented to person, place, and time. No cranial nerve deficit.  5/5 and equal upper and lower extremity strength bilaterally. Equal grip strength bilaterally. Normal finger to nose and heel to shin. No pronator drift.   Skin: Skin is warm and dry.  Nursing note and vitals reviewed.    ED Treatments / Results  Labs (all labs ordered are listed, but only abnormal results are displayed) Labs Reviewed  I-STAT TROPONIN, ED - Abnormal; Notable for the following components:      Result Value   Troponin i, poc 0.31 (*)    All other components within normal limits  I-STAT CG4 LACTIC ACID, ED - Abnormal; Notable for the following components:   Lactic Acid, Venous 2.99 (*)    All other components within normal limits  CBC  COMPREHENSIVE METABOLIC PANEL  BRAIN NATRIURETIC PEPTIDE  URINALYSIS, ROUTINE W REFLEX MICROSCOPIC    EKG  EKG Interpretation  Date/Time:  Sunday November 06 2017 15:48:35 EST Ventricular Rate:  64 PR Interval:    QRS Duration: 96 QT Interval:  483 QTC Calculation: 499 R Axis:   83 Text Interpretation:  Sinus rhythm Probable left atrial enlargement Borderline right axis deviation Anteroseptal infarct, old Confirmed by Benjiman Core [phone]) on 11/06/2017 4:19:39 PM       Radiology Dg Chest 2  View  Result Date: 11/06/2017 CLINICAL DATA:  Shortness of breath for 3 weeks. EXAM: CHEST  2 VIEW COMPARISON:  November 02, 2017 FINDINGS: Mediastinal contour is normal. The heart size is mildly enlarged. There is central pulmonary vascular congestion. There is no focal pneumonia. Minimal bilateral pleural effusions are noted. The visualized skeletal structures are unremarkable. IMPRESSION: Mild cardiomegaly with central pulmonary vascular congestion. Minimal bilateral pleural effusions. Electronically Signed   By: Sherian Rein M.D.   On: 11/06/2017 16:24    Procedures Procedures (including critical care time)  Medications Ordered in ED Medications  furosemide (LASIX) injection 40 mg (not administered)  aspirin chewable tablet 324 mg (324 mg Oral Given 11/06/17 1632)     Initial Impression / Assessment and Plan / ED Course  I have reviewed the triage vital signs and the nursing notes.  Pertinent labs & imaging results that were available during my care of the patient were reviewed by me and considered in my medical decision making (see chart for details).    Patient in emergency department with altered mental status, confusion and  hallucinations for about a day.  Recently started on Lasix, potassium, metoprolol, will check electrolytes, will get blood counts as well, troponin, lactic acid.  Will get chest x-ray and CT head.  Urinalysis ordered as well.  Will monitor.  Patient is found to be hypoxic with oxygen saturation in the 80s, placed on oxygen.  CT had unremarkable, chest x-ray showing mild cardiomegaly with central pulmonary vascular congestion, minimal bilateral pleural effusions.  Lasix ordered to have with breathing, patient is hypoxic.  His oxygen saturation is in the mid 90s on 2 L..  Labs unremarkable other than slightly elevated BUN, creatinine 1.76, at baseline.  Troponin elevated at 0.31, aspirin given.  Patient denies any chest pain at this time.  His EKG does not show any  acute findings.  BNP is 2055.  Question whether his troponin could be elevated from his heart failure.  I will call medicine for admission at this time.  7:15 PM Spoke with medicine, they will admit.  Vitals:   11/06/17 1645 11/06/17 1657 11/06/17 1826 11/06/17 1830  BP: (!) 123/97  121/85 121/83  Pulse: 72  72   Resp: 11   (!) 22  Temp:      TempSrc:      SpO2: (!) 86% 98% 96%      Final Clinical Impressions(s) / ED Diagnoses   Final diagnoses:  Congestive heart failure, unspecified HF chronicity, unspecified heart failure type [facility])  Generalized weakness  Elevated troponin    ED Discharge Orders    None      Jaynie Crumble, PA-C 11/07/17 0033  Benjiman Core, MD 11/07/17 0040

## 2017-11-06 NOTE — H&P (Addendum)
History and Physical    [name] [name] ZOX:[id number] DOB: 01-20-1933 DOA: 11/06/2017  Referring MD/NP/PA:   PCP: Burtis Junes, MD (Inactive)   Patient coming from:  The patient is coming from home.  At baseline, pt is independent for most of ADL.  Chief Complaint: SOB, confusion, poor ballance, fall and diarrhea  HPI: [name] [name] is a 84 y.o. male with medical history significant of hypertension, hypothyroidism, CAD, myocardial infarction, s/p of stent placement (per patient's report, 2009), tobacco abuse, CKD-2, who presents with shortness breath, confusion, poor balance, fall and diarrhea.  Pt has mild confusion, but is still oriented 3. He could answer most of the questions appropriately. His sister is at bedside, who also helped in collecting the history. Per his sister, pt has been having SOB for more than 2 months, which has been progressively getting worse. He has nonproductive cough. Patient was seen by his doctor, and diagnosed as acute new onset CHF, started the patient with Lasix on Wednesday. He was also given referral to cardiologist, but not seen yet. Patient denies any fever or chills. No chest pain. Patient states that he has been having mild intermittent diarrhea in the past 3 weeks, he has 1-2 loose stool bowel movement each day, which has improved. He did not have diarrhea today. Denies nausea, vomiting or abdominal pain. Patient does not have dysuria or burning on urination. Per his sister, patient was noted to have poor balance. He had fall 1 week ago. He denies LOC, no head or neck injury. Denies headache or neck pain. He has small skin tear over both knees. Patient does not have unilateral weakness, numbness or tingling in extremities. No vision change or hearing loss.  ED Course: pt was found to have positive troponin 0.13, BNP 2055, WBC 11.1, lactic acid 2.99, abnormal liver functions with AST 78, ALT 59, normal ALP and total bilirubin, ammonia 22, negative  urinalysis, worsening renal function, temperature normal, no tachycardia, has tachypnea, oxygen saturation to 84% on room air, chest x-ray showed vascular congestion and a bilateral small pleural effusion, CT head is negative for acute intracranial abnormalities,  Review of Systems:   General: no fevers, chills, has poor appetite, has fatigue HEENT: no blurry vision, hearing changes or sore throat Respiratory: has dyspnea, coughing, no wheezing CV: no chest pain, no palpitations GI: no nausea, vomiting, abdominal pain, has diarrhea, no constipation GU: no dysuria, burning on urination, increased urinary frequency, hematuria  Ext: has leg edema Neuro: no unilateral weakness, numbness, or tingling, no vision change or hearing loss. Has AMS and poor balance. Skin: no rash, has small skin tear in both knees. MSK: No muscle spasm, no deformity, no limitation of range of movement in spin Heme: No easy bruising.  Travel history: No recent long distant travel.  Allergy: No Known Allergies  Past Medical History:  Diagnosis Date  . CHF (congestive heart failure) (HCC)   . CKD (chronic kidney disease), stage II   . Heart attack (HCC)   . Hypertension     Past Surgical History:  Procedure Laterality Date  . CORONARY STENT PLACEMENT     per patient's report    Social History:  reports that he has been smoking.  He has a 35.00 pack-year smoking history. he has never used smokeless tobacco. He reports that he does not drink alcohol or use drugs.  Family History:  Family History  Problem Relation Age of Onset  . Aneurysm Mother   . Heart failure Brother  Prior to Admission medications   Medication Sig Start Date End Date Taking? Authorizing Provider  albuterol (PROVENTIL HFA;VENTOLIN HFA) 108 (90 Base) MCG/ACT inhaler Inhale 2 puffs into the lungs every 4 (four) hours as needed for wheezing or shortness of breath. 01/02/17  Yes Cathren Laine, MD  furosemide (LASIX) 20 MG tablet Take  20 mg by mouth daily. 11/02/17  Yes [provider]  metoprolol tartrate (LOPRESSOR) 25 MG tablet Take 25 mg by mouth 2 (two) times daily. 11/02/17  Yes [provider]  potassium chloride (K-DUR) 10 MEQ tablet Take 10 mEq by mouth 2 (two) times daily. 11/02/17  Yes [provider]  budesonide-formoterol (SYMBICORT) 160-4.5 MCG/ACT inhaler Inhale 2 puffs into the lungs 2 (two) times daily. Patient not taking: Reported on 11/06/2017 01/28/17 04/13/17  Chilton Greathouse, MD    Physical Exam: Vitals:   11/06/17 1657 11/06/17 1826 11/06/17 1830 11/06/17 1900  BP:  121/85 121/83 117/69  Pulse:  72    Resp:   (!) 22 (!) 28  Temp:      TempSrc:      SpO2: 98% 96%     General: Not in acute distress HEENT:       Eyes: PERRL, EOMI, no scleral icterus.       ENT: No discharge from the ears and nose, no pharynx injection, no tonsillar enlargement.        Neck: postive JVD, no bruit, no mass felt. Heme: No neck lymph node enlargement. Cardiac: S1/S2, RRR, No murmurs, No gallops or rubs. Respiratory: decreased air movement bilaterally. No rales, wheezing, rhonchi or rubs. GI: Soft, nondistended, nontender, no rebound pain, no organomegaly, BS present. GU: No hematuria Ext: 1+ pitting leg edema bilaterally. 2+DP/PT pulse bilaterally. Musculoskeletal: No joint deformities, No joint redness or warmth, no limitation of ROM in spin. Skin: No rashes.  Neuro: confused, but still oriented X3, cranial nerves II-XII grossly intact, moves all extremities normally. Muscle strength 5/5 in all extremities, sensation to light touch intact. Brachial reflex 2+ bilaterally.  Psych: Patient is not psychotic, no suicidal or hemocidal ideation.  Labs on Admission: I have personally reviewed following labs and imaging studies  CBC: Recent Labs  Lab 11/06/17 1542  WBC 11.1*  HGB 15.6  HCT 47.1  MCV 96.5  PLT 136*   Basic Metabolic Panel: Recent Labs  Lab 11/06/17 1651  NA 144  K 4.8  CL  108  CO2 25  GLUCOSE 123*  BUN 42*  CREATININE 1.76*  CALCIUM 9.0   GFR: CrCl cannot be calculated (Unknown ideal weight.). Liver Function Tests: Recent Labs  Lab 11/06/17 1651  AST 78*  ALT 59  ALKPHOS 110  BILITOT 1.1  PROT 6.0*  ALBUMIN 3.6   No results for input(s): LIPASE, AMYLASE in the last 168 hours. Recent Labs  Lab 11/06/17 1651  AMMONIA 22   Coagulation Profile: No results for input(s): INR, PROTIME in the last 168 hours. Cardiac Enzymes: No results for input(s): CKTOTAL, CKMB, CKMBINDEX, TROPONINI in the last 168 hours. BNP (last 3 results) No results for input(s): PROBNP in the last 8760 hours. HbA1C: No results for input(s): HGBA1C in the last 72 hours. CBG: No results for input(s): GLUCAP in the last 168 hours. Lipid Profile: No results for input(s): CHOL, HDL, LDLCALC, TRIG, CHOLHDL, LDLDIRECT in the last 72 hours. Thyroid Function Tests: No results for input(s): TSH, T4TOTAL, FREET4, T3FREE, THYROIDAB in the last 72 hours. Anemia Panel: No results for input(s): VITAMINB12, FOLATE, FERRITIN, TIBC, IRON,  RETICCTPCT in the last 72 hours. Urine analysis:    Component Value Date/Time   COLORURINE STRAW (A) 11/06/2017 1804   APPEARANCEUR CLEAR 11/06/2017 1804   LABSPEC 1.009 11/06/2017 1804   PHURINE 5.0 11/06/2017 1804   GLUCOSEU NEGATIVE 11/06/2017 1804   HGBUR NEGATIVE 11/06/2017 1804   BILIRUBINUR NEGATIVE 11/06/2017 1804   KETONESUR NEGATIVE 11/06/2017 1804   PROTEINUR NEGATIVE 11/06/2017 1804   NITRITE NEGATIVE 11/06/2017 1804   LEUKOCYTESUR NEGATIVE 11/06/2017 1804   Sepsis Labs: @LABRCNTIP (procalcitonin:4,lacticidven:4) )No results found for this or any previous visit (from the past 240 hour(s)).   Radiological Exams on Admission: Dg Chest 2 View  Result Date: 11/06/2017 CLINICAL DATA:  Shortness of breath for 3 weeks. EXAM: CHEST  2 VIEW COMPARISON:  November 02, 2017 FINDINGS: Mediastinal contour is normal. The heart size is mildly  enlarged. There is central pulmonary vascular congestion. There is no focal pneumonia. Minimal bilateral pleural effusions are noted. The visualized skeletal structures are unremarkable. IMPRESSION: Mild cardiomegaly with central pulmonary vascular congestion. Minimal bilateral pleural effusions. Electronically Signed   By: Sherian ReinWei-Chen  Lin M.D.   On: 11/06/2017 16:24   Ct Head Wo Contrast  Result Date: 11/06/2017 CLINICAL DATA:  Mental status changes and generalize weakness for 2 days. EXAM: CT HEAD WITHOUT CONTRAST TECHNIQUE: Contiguous axial images were obtained from the base of the skull through the vertex without intravenous contrast. COMPARISON:  03/02/2010 FINDINGS: Brain: Progressive age related cerebral atrophy, ventriculomegaly and periventricular white matter disease. Remote lacunar type basal ganglia infarcts are noted. No CT findings for acute hemispheric infarction or intracranial hemorrhage. No extra-axial fluid collections are identified. The brainstem and cerebellum are grossly normal. Vascular: Stable advanced vascular calcifications but no hyperdense vessels or obvious aneurysm. Skull: No acute bony findings or skull fracture. Sinuses/Orbits: The paranasal sinuses and mastoid air cells are clear except for right maxillary sinus disease. The globes are intact. Other: No scalp lesion or hematoma. IMPRESSION: 1. Progressive age related cerebral atrophy, ventriculomegaly and periventricular white matter disease. 2. No acute intracranial findings or mass lesions. 3. No acute bony findings. Electronically Signed   By: Rudie MeyerP.  Gallerani M.D.   On: 11/06/2017 17:26     EKG: Independently reviewed.  Sinus rhythm, QTC 490, low voltage, LAE, anteroseptal infarction pattern  Assessment/Plan Principal Problem:   Acute diastolic (congestive) heart failure (HCC) Active Problems:   Hypothyroidism   TOBACCO ABUSE   HYPERTENSION, BENIGN ESSENTIAL   CAD (coronary artery disease)   Acute metabolic  encephalopathy   Elevated troponin   Fall   Lactic acidosis   Diarrhea   Acute renal failure superimposed on stage 2 chronic kidney disease (HCC)   Abnormal LFTs   Acute on chronic respiratory failure with hypoxia (HCC)   CHF exacerbation (HCC)   Acute respiratory failure due to acute diastolic CHF (congestive heart failure) (HCC): pt has SOB, leg edema, positive JVD, elevated BNP 2055, vascular congestion on chest x-ray, clinically consistent with CHF. Patient did not have 2-D echo before, not sure which type of CHF, given his long history of hypertension, likely to have acute diastolic CHF. Pt has severely decreased air movement bilaterally, he may have a component of COPD given long history of smoking  -will admit to tele bed as inpt. -pt was given one dose of Lasix 40 mg in ED, will not order more due to worsening renal function. Will need to reevaluate volume status and renal function in morning to decide further diuresis -trop x 3 -2d echo -will  continue home metoprolol -start ASA -Daily weights -strict I/O's -Low salt diet - Atrovent nebs, prn albuterol nebs and prn mucinex  Elevated trop and hx of CAD: Patient reported that he had stent placement 2009. Troponin 0.31, but no chest pain. Likely due to demand ischemia secondary to CHF. - cycle CE q6 x3 and repeat EKG in the am  - prn Nitroglycerin, Morphine - start aspirin, lipitor  - Risk factor stratification: will check FLP and A1C  - 2d echo - Inpatient non-urgent card consult order was put in Epic and message to Daun Peacock was sent out.  Hypothyroidism: Last TSH was 0.787 on 10/10/12. Not taking meds now -Check TSH and free T4  HYPERTENSION, BENIGN ESSENTIAL: -continue metoprolol -IVF hydralazine when necessary  Diarrhea: He has been having mild intermittent diarrhea in the past 3 weeks, he has 1-2 loose stool bowel movement each day, which has improved. He did not have diarrhea today. -observe now. If has diarrhea  again, will check c diff pcr -prn zofran for nausea  Acute renal failure superimposed on stage 2 chronic kidney disease: Baseline creatinine 1.0-1.1, his creatinine is 1.76, BUN 48. Likely due to prerenal secondary to dehydration from diarrhea nd continuation of diuretics - since pt has acute CHF, will hold off IVF - Check FeUrea - Follow up renal function by BMP  Abnormal LFTs: likely due to liver congestion 2/2 CHF. Ammonia 22 -will check hepatitis panel -Avoid the Tylenol  Lactic acidosis: Likely due to dehydration. Patient has some mild leukocytosis, but no fever, does not seem to have sepsis. -will get Procalcitonin and trend lactic acid levels per sepsis protocol. -hold off IVF due to CHF -f/u Bx and Ux -if lactic acid trending up significantly, will start gentle IVF  Tobacco abuse -Did counseling about importance of quitting smoking -Nicotine patch  Fall and poor balance: CT-head negative. -will get MRI of brain to r/o stroke -Frequent neurologic checks -Wound care consult for skin tear -pt/ot  Acute metabolic encephalopathy: Likely multifactorial etiology, including worsening renal function, CHF, hypoxia -Frequent neuro check -check Vb12 and TSH   DVT ppx: SQ Lovenox Code Status: Full code Family Communication: Yes, patient's 2 sisters at bed side Disposition Plan:  Anticipate discharge back to previous home environment Consults called:  none Admission status:   Inpatient/tele    Date of Service 11/06/2017    Lorretta Harp [facility] Pager [phone]  If 7PM-7AM, please contact night-coverage [email or web address] Password [facility] 11/06/2017, 8:14 PM

## 2017-11-06 NOTE — ED Notes (Signed)
Dr. Rubin PayorPickering notified of elevated Cg-4 and trop

## 2017-11-06 NOTE — ED Notes (Signed)
Patient on 3 L of oxygen  

## 2017-11-06 NOTE — ED Triage Notes (Signed)
[facility] "generalized weakness for the past 2 days".  Recent dx of CHF and began on lasix this past Friday.  Pt has no focal weakness, he was alert and oriented for ems.  18g LAC. 93% on RA for ems.

## 2017-11-06 NOTE — ED Notes (Signed)
PA at bedside.

## 2017-11-06 NOTE — ED Notes (Signed)
ED Provider at bedside. [name] [name]

## 2017-11-07 ENCOUNTER — Inpatient Hospital Stay (HOSPITAL_COMMUNITY): Payer: Medicare HMO

## 2017-11-07 ENCOUNTER — Other Ambulatory Visit: Payer: Self-pay

## 2017-11-07 DIAGNOSIS — I471 Supraventricular tachycardia: Secondary | ICD-10-CM

## 2017-11-07 DIAGNOSIS — N170 Acute kidney failure with tubular necrosis: Secondary | ICD-10-CM

## 2017-11-07 LAB — BASIC METABOLIC PANEL
Anion gap: 12 (ref 5–15)
Anion gap: 14 (ref 5–15)
BUN: 39 mg/dL — AB (ref 6–20)
BUN: 41 mg/dL — AB (ref 6–20)
CALCIUM: 8.5 mg/dL — AB (ref 8.9–10.3)
CALCIUM: 8.3 mg/dL — AB (ref 8.9–10.3)
CHLORIDE: 105 mmol/L (ref 101–111)
CO2: 25 mmol/L (ref 22–32)
CO2: 24 mmol/L (ref 22–32)
Chloride: 108 mmol/L (ref 101–111)
Creatinine, Ser: 1.58 mg/dL — ABNORMAL HIGH (ref 0.61–1.24)
Creatinine, Ser: 1.79 mg/dL — ABNORMAL HIGH (ref 0.61–1.24)
GFR calc Af Amer: 45 mL/min — ABNORMAL LOW (ref >60)
GFR calc non Af Amer: 34 mL/min — ABNORMAL LOW (ref >60)
GFR, EST AFRICAN AMERICAN: 39 mL/min — AB (ref >60)
GFR, EST NON AFRICAN AMERICAN: 39 mL/min — AB (ref >60)
Glucose, Bld: 121 mg/dL — ABNORMAL HIGH (ref 65–99)
Glucose, Bld: 99 mg/dL (ref 65–99)
POTASSIUM: 4.2 mmol/L (ref 3.5–5.1)
Potassium: 4.6 mmol/L (ref 3.5–5.1)
SODIUM: 145 mmol/L (ref 135–145)
SODIUM: 143 mmol/L (ref 135–145)

## 2017-11-07 LAB — RAPID URINE DRUG SCREEN, HOSP PERFORMED
AMPHETAMINES: NOT DETECTED
BENZODIAZEPINES: NOT DETECTED
Barbiturates: NOT DETECTED
COCAINE: NOT DETECTED
Opiates: NOT DETECTED
Tetrahydrocannabinol: NOT DETECTED

## 2017-11-07 LAB — GLUCOSE, CAPILLARY
GLUCOSE-CAPILLARY: 45 mg/dL — AB (ref 65–99)
GLUCOSE-CAPILLARY: 46 mg/dL — AB (ref 65–99)
GLUCOSE-CAPILLARY: 48 mg/dL — AB (ref 65–99)
GLUCOSE-CAPILLARY: 99 mg/dL (ref 65–99)
GLUCOSE-CAPILLARY: 51 mg/dL — AB (ref 65–99)
GLUCOSE-CAPILLARY: 62 mg/dL — AB (ref 65–99)
GLUCOSE-CAPILLARY: 48 mg/dL — AB (ref 65–99)
Glucose-Capillary: 39 mg/dL — CL (ref 65–99)
Glucose-Capillary: 80 mg/dL (ref 65–99)
Glucose-Capillary: 86 mg/dL (ref 65–99)
Glucose-Capillary: 56 mg/dL — ABNORMAL LOW (ref 65–99)
Glucose-Capillary: 85 mg/dL (ref 65–99)
Glucose-Capillary: 93 mg/dL (ref 65–99)

## 2017-11-07 LAB — ECHOCARDIOGRAM COMPLETE
Height: 66 in
WEIGHTICAEL: 2345.69 [oz_av]

## 2017-11-07 LAB — LIPID PANEL
CHOLESTEROL: 149 mg/dL (ref 0–200)
HDL: 42 mg/dL (ref >40)
LDL Cholesterol: 94 mg/dL (ref 0–99)
TRIGLYCERIDES: 64 mg/dL (ref <150)
Total CHOL/HDL Ratio: 3.5 RATIO
VLDL: 13 mg/dL (ref 0–40)

## 2017-11-07 LAB — MAGNESIUM: Magnesium: 2 mg/dL (ref 1.7–2.4)

## 2017-11-07 LAB — TROPONIN I
Troponin I: 0.44 ng/mL (ref <0.03)
Troponin I: 0.37 ng/mL (ref <0.03)

## 2017-11-07 LAB — T4, FREE: Free T4: 1.54 ng/dL — ABNORMAL HIGH (ref 0.61–1.12)

## 2017-11-07 LAB — HEMOGLOBIN A1C
HEMOGLOBIN A1C: 6.3 % — AB (ref 4.8–5.6)
MEAN PLASMA GLUCOSE: 134.11 mg/dL

## 2017-11-07 LAB — HEPARIN LEVEL (UNFRACTIONATED): HEPARIN UNFRACTIONATED: 0.47 [IU]/mL (ref 0.30–0.70)

## 2017-11-07 LAB — HEPATITIS PANEL, ACUTE
HCV Ab: 0.1 s/co ratio (ref 0.0–0.9)
Hep A IgM: NEGATIVE
Hep B C IgM: NEGATIVE
Hepatitis B Surface Ag: NEGATIVE

## 2017-11-07 LAB — TSH: TSH: 1.34 u[IU]/mL (ref 0.350–4.500)

## 2017-11-07 LAB — CORTISOL: CORTISOL PLASMA: 15.9 ug/dL

## 2017-11-07 LAB — CREATININE, URINE, RANDOM: CREATININE, URINE: 22.01 mg/dL

## 2017-11-07 MED ORDER — FUROSEMIDE 10 MG/ML IJ SOLN
40.0000 mg | Freq: Once | INTRAMUSCULAR | Status: AC
  Administered 2017-11-07: 40 mg via INTRAVENOUS
  Filled 2017-11-07: qty 4

## 2017-11-07 MED ORDER — DEXTROSE 50 % IV SOLN
INTRAVENOUS | Status: AC
  Administered 2017-11-07: 14:00:00
  Filled 2017-11-07: qty 50

## 2017-11-07 MED ORDER — HEPARIN (PORCINE) IN NACL 100-0.45 UNIT/ML-% IJ SOLN
950.0000 [IU]/h | INTRAMUSCULAR | Status: DC
  Administered 2017-11-07: 950 [IU]/h via INTRAVENOUS
  Filled 2017-11-07 (×3): qty 250

## 2017-11-07 MED ORDER — FUROSEMIDE 10 MG/ML IJ SOLN
40.0000 mg | Freq: Three times a day (TID) | INTRAMUSCULAR | Status: DC
  Administered 2017-11-07 – 2017-11-08 (×3): 40 mg via INTRAVENOUS
  Filled 2017-11-07 (×3): qty 4

## 2017-11-07 MED ORDER — METOPROLOL TARTRATE 5 MG/5ML IV SOLN
5.0000 mg | INTRAVENOUS | Status: DC | PRN
  Administered 2017-11-07 – 2017-11-12 (×8): 5 mg via INTRAVENOUS
  Filled 2017-11-07 (×11): qty 5

## 2017-11-07 MED ORDER — HEPARIN BOLUS VIA INFUSION
1500.0000 [IU] | Freq: Once | INTRAVENOUS | Status: AC
  Administered 2017-11-07: 1500 [IU] via INTRAVENOUS
  Filled 2017-11-07: qty 1500

## 2017-11-07 MED ORDER — DEXTROSE 50 % IV SOLN
25.0000 mL | Freq: Once | INTRAVENOUS | Status: AC
  Administered 2017-11-07: 25 mL via INTRAVENOUS
  Filled 2017-11-07: qty 50

## 2017-11-07 MED ORDER — MUPIROCIN CALCIUM 2 % EX CREA
TOPICAL_CREAM | Freq: Every day | CUTANEOUS | Status: DC
  Administered 2017-11-07 – 2017-11-08 (×2): via TOPICAL
  Administered 2017-11-09: 1 via TOPICAL
  Administered 2017-11-11 – 2017-11-17 (×3): via TOPICAL
  Filled 2017-11-07 (×4): qty 15

## 2017-11-07 MED ORDER — DEXTROSE 50 % IV SOLN
INTRAVENOUS | Status: AC
  Filled 2017-11-07: qty 50

## 2017-11-07 MED ORDER — DEXTROSE 50 % IV SOLN
50.0000 mL | Freq: Once | INTRAVENOUS | Status: AC
  Administered 2017-11-07: 50 mL via INTRAVENOUS

## 2017-11-07 MED ORDER — GLUCOSE 40 % PO GEL
ORAL | Status: AC
  Administered 2017-11-07: 37.5 g
  Filled 2017-11-07: qty 1

## 2017-11-07 MED ORDER — IPRATROPIUM-ALBUTEROL 0.5-2.5 (3) MG/3ML IN SOLN
3.0000 mL | RESPIRATORY_TRACT | Status: DC | PRN
  Administered 2017-11-15: 3 mL via RESPIRATORY_TRACT
  Filled 2017-11-07: qty 3

## 2017-11-07 MED ORDER — DEXTROSE 10 % IV SOLN
INTRAVENOUS | Status: DC
  Administered 2017-11-07: 1 mL via INTRAVENOUS

## 2017-11-07 MED ORDER — GLUCOSE 40 % PO GEL
ORAL | Status: AC
  Administered 2017-11-07: 14:00:00
  Filled 2017-11-07: qty 1

## 2017-11-07 NOTE — Consult Note (Signed)
Reason for Consult: Shortness of breath, confusion Referring Physician: triad hospitalist  [name] [name] is an 84 y.o. male.  HPI:   84 year old African-American male with coronary artery disease status post MI in 2012 treated medically due to late presentation, hypertension, tobacco abuse, chronic kidney disease, recent clinical diagnosis of heart failure.  Patient was referred to [place] for outpatient evaluation had not seen as yet.  Over the weekend, patient was admitted to the hospital with increasing shortness of breath and confusion.  Patient has been having exertional dyspnea with minimal activity for the last several weeks.  For the last few days, he has had episodes of confusion, poor balance, fall, and diarrhea.   He lives alone and is a widower.  History corroborated with patient's sister Edd Fabian, and son Vicente Males.  He denies chest pain, presyncope, syncope.  During hospitalization, he has had episodes of tachycardia and to 150s, which on telemetry review appears to be proximal atrial flutter.  With atrial flutter, he is an episode of systolic blood pressure in 80s.  His also been having episodes of hypoglycemia.  Past Medical History:  Diagnosis Date  . CHF (congestive heart failure) ([place])   . CKD (chronic kidney disease), stage II   . Heart attack ([place])   . Hypertension     Past Surgical History:  Procedure Laterality Date  . CORONARY STENT PLACEMENT     per patient's report    Family History  Problem Relation Age of Onset  . Aneurysm Mother   . Heart failure Brother     Social History:  reports that he has been smoking.  He has a 35.00 pack-year smoking history. he has never used smokeless tobacco. He reports that he does not drink alcohol or use drugs.  Allergies: No Known Allergies  Medications: I have reviewed the patient's current medications.  Results for orders placed or performed during the hospital encounter of 11/06/17 (from the past 48 hour(s))  CBC     Status:  Abnormal   Collection Time: 11/06/17  3:42 PM  Result Value Ref Range   WBC 11.1 (H) 4.0 - 10.5 K/uL   RBC 4.88 4.22 - 5.81 MIL/uL   Hemoglobin 15.6 13.0 - 17.0 g/dL   HCT 47.1 39.0 - 52.0 %   MCV 96.5 78.0 - 100.0 fL   MCH 32.0 26.0 - 34.0 pg   MCHC 33.1 30.0 - 36.0 g/dL   RDW 15.8 (H) 11.5 - 15.5 %   Platelets 136 (L) 150 - 400 K/uL    Comment: SPECIMEN CHECKED FOR CLOTS REPEATED TO VERIFY PLATELET COUNT CONFIRMED BY SMEAR   Brain natriuretic peptide     Status: Abnormal   Collection Time: 11/06/17  3:54 PM  Result Value Ref Range   B Natriuretic Peptide 2,055.2 (H) 0.0 - 100.0 pg/mL  I-stat troponin, ED     Status: Abnormal   Collection Time: 11/06/17  4:07 PM  Result Value Ref Range   Troponin i, poc 0.31 (HH) 0.00 - 0.08 ng/mL   Comment NOTIFIED PHYSICIAN    Comment 3            Comment: Due to the release kinetics of cTnI, a negative result within the first hours of the onset of symptoms does not rule out myocardial infarction with certainty. If myocardial infarction is still suspected, repeat the test at appropriate intervals.   I-Stat CG4 Lactic Acid, ED     Status: Abnormal   Collection Time: 11/06/17  4:09 PM  Result Value  Ref Range   Lactic Acid, Venous 2.99 (HH) 0.5 - 1.9 mmol/L   Comment NOTIFIED PHYSICIAN   Ammonia     Status: None   Collection Time: 11/06/17  4:51 PM  Result Value Ref Range   Ammonia 22 9 - 35 umol/L  Comprehensive metabolic panel     Status: Abnormal   Collection Time: 11/06/17  4:51 PM  Result Value Ref Range   Sodium 144 135 - 145 mmol/L   Potassium 4.8 3.5 - 5.1 mmol/L   Chloride 108 101 - 111 mmol/L   CO2 25 22 - 32 mmol/L   Glucose, Bld 123 (H) 65 - 99 mg/dL   BUN 42 (H) 6 - 20 mg/dL   Creatinine, Ser 1.76 (H) 0.61 - 1.24 mg/dL   Calcium 9.0 8.9 - 10.3 mg/dL   Total Protein 6.0 (L) 6.5 - 8.1 g/dL   Albumin 3.6 3.5 - 5.0 g/dL   AST 78 (H) 15 - 41 U/L   ALT 59 17 - 63 U/L   Alkaline Phosphatase 110 38 - 126 U/L   Total  Bilirubin 1.1 0.3 - 1.2 mg/dL   GFR calc non Af Amer 34 (L) >60 mL/min   GFR calc Af Amer 40 (L) >60 mL/min    Comment: (NOTE) The eGFR has been calculated using the CKD EPI equation. This calculation has not been validated in all clinical situations. eGFR's persistently <60 mL/min signify possible Chronic Kidney Disease.    Anion gap 11 5 - 15  Urinalysis, Routine w reflex microscopic     Status: Abnormal   Collection Time: 11/06/17  6:04 PM  Result Value Ref Range   Color, Urine STRAW (A) YELLOW   APPearance CLEAR CLEAR   Specific Gravity, Urine 1.009 1.005 - 1.030   pH 5.0 5.0 - 8.0   Glucose, UA NEGATIVE NEGATIVE mg/dL   Hgb urine dipstick NEGATIVE NEGATIVE   Bilirubin Urine NEGATIVE NEGATIVE   Ketones, ur NEGATIVE NEGATIVE mg/dL   Protein, ur NEGATIVE NEGATIVE mg/dL   Nitrite NEGATIVE NEGATIVE   Leukocytes, UA NEGATIVE NEGATIVE   RBC / HPF 0-5 0 - 5 RBC/hpf   WBC, UA 0-5 0 - 5 WBC/hpf   Bacteria, UA RARE (A) NONE SEEN   Squamous Epithelial / LPF 0-5 (A) NONE SEEN   Mucus PRESENT    Hyaline Casts, UA PRESENT    Granular Casts, UA PRESENT    Sperm, UA PRESENT   Vitamin B12     Status: Abnormal   Collection Time: 11/06/17  7:35 PM  Result Value Ref Range   Vitamin B-12 931 (H) 180 - 914 pg/mL    Comment: (NOTE) This assay is not validated for testing neonatal or myeloproliferative syndrome specimens for Vitamin B12 levels.   Troponin I (q 6hr x 3)     Status: Abnormal   Collection Time: 11/06/17  7:35 PM  Result Value Ref Range   Troponin I 0.29 (HH) <0.03 ng/mL    Comment: CRITICAL RESULT CALLED TO, READ BACK BY AND VERIFIED WITH: J.OLLISON,RN 2042 11/06/17 G.[email or web address]   Lactic acid, plasma     Status: Abnormal   Collection Time: 11/06/17  7:35 PM  Result Value Ref Range   Lactic Acid, Venous 2.5 (HH) 0.5 - 1.9 mmol/L    Comment: CRITICAL RESULT CALLED TO, READ BACK BY AND VERIFIED WITH: J.OLLISON,RN 2042 11/06/17 [email or web address]   Procalcitonin     Status: None    Collection Time: 11/06/17  7:35 PM  Result Value Ref Range  Procalcitonin 0.10 ng/mL    Comment:        Interpretation: PCT (Procalcitonin) <= 0.5 ng/mL: Systemic infection (sepsis) is not likely. Local bacterial infection is possible. (NOTE)       Sepsis PCT Algorithm           Lower Respiratory Tract                                      Infection PCT Algorithm    ----------------------------     ----------------------------         PCT < 0.25 ng/mL                PCT < 0.10 ng/mL         Strongly encourage             Strongly discourage   discontinuation of antibiotics    initiation of antibiotics    ----------------------------     -----------------------------       PCT 0.25 - 0.50 ng/mL            PCT 0.10 - 0.25 ng/mL               OR       >80% decrease in PCT            Discourage initiation of                                            antibiotics      Encourage discontinuation           of antibiotics    ----------------------------     -----------------------------         PCT >= 0.50 ng/mL              PCT 0.26 - 0.50 ng/mL               AND        <80% decrease in PCT             Encourage initiation of                                             antibiotics       Encourage continuation           of antibiotics    ----------------------------     -----------------------------        PCT >= 0.50 ng/mL                  PCT > 0.50 ng/mL               AND         increase in PCT                  Strongly encourage                                      initiation of antibiotics    Strongly encourage escalation           of antibiotics                                     -----------------------------  PCT <= 0.25 ng/mL                                                 OR                                        > 80% decrease in PCT                                     Discontinue / Do not initiate                                              antibiotics   Protime-INR     Status: None   Collection Time: 11/06/17  7:35 PM  Result Value Ref Range   Prothrombin Time 15.2 11.4 - 15.2 seconds   INR 1.21   Culture, blood (Routine X 2) w Reflex to ID Panel     Status: None (Preliminary result)   Collection Time: 11/06/17  7:57 PM  Result Value Ref Range   Specimen Description BLOOD RIGHT HAND    Special Requests IN PEDIATRIC BOTTLE Blood Culture adequate volume    Culture PENDING    Report Status PENDING   Lactic acid, plasma     Status: Abnormal   Collection Time: 11/06/17 10:28 PM  Result Value Ref Range   Lactic Acid, Venous 2.3 (HH) 0.5 - 1.9 mmol/L    Comment: CRITICAL RESULT CALLED TO, READ BACK BY AND VERIFIED WITH: Acey Lav [id number] WILDERK   Creatinine, urine, random     Status: None   Collection Time: 11/07/17 12:55 AM  Result Value Ref Range   Creatinine, Urine 22.01 mg/dL  TSH     Status: None   Collection Time: 11/07/17  1:52 AM  Result Value Ref Range   TSH 1.340 0.350 - 4.500 uIU/mL    Comment: Performed by a 3rd Generation assay with a functional sensitivity of <=0.01 uIU/mL.  T4, free     Status: Abnormal   Collection Time: 11/07/17  1:52 AM  Result Value Ref Range   Free T4 1.54 (H) 0.61 - 1.12 ng/dL    Comment: (NOTE) Biotin ingestion may interfere with free T4 tests. If the results are inconsistent with the TSH level, previous test results, or the clinical presentation, then consider biotin interference. If needed, order repeat testing after stopping biotin.   Hemoglobin A1c     Status: Abnormal   Collection Time: 11/07/17  1:52 AM  Result Value Ref Range   Hgb A1c MFr Bld 6.3 (H) 4.8 - 5.6 %    Comment: (NOTE) Pre diabetes:          5.7%-6.4% Diabetes:              >6.4% Glycemic control for   <7.0% adults with diabetes    Mean Plasma Glucose 134.11 mg/dL  Troponin I (q 6hr x 3)     Status: Abnormal   Collection Time: 11/07/17  1:52 AM  Result Value Ref Range   Troponin  I 0.44 (HH) <  0.03 ng/mL    Comment: CRITICAL VALUE NOTED.  VALUE IS CONSISTENT WITH PREVIOUSLY REPORTED AND CALLED VALUE.  Lipid panel     Status: None   Collection Time: 11/07/17  2:01 AM  Result Value Ref Range   Cholesterol 149 0 - 200 mg/dL   Triglycerides 64 <150 mg/dL   HDL 42 >40 mg/dL   Total CHOL/HDL Ratio 3.5 RATIO   VLDL 13 0 - 40 mg/dL   LDL Cholesterol 94 0 - 99 mg/dL    Comment:        Total Cholesterol/HDL:CHD Risk Coronary Heart Disease Risk Table                     Men   Women  1/2 Average Risk   3.4   3.3  Average Risk       5.0   4.4  2 X Average Risk   9.6   7.1  3 X Average Risk  23.4   11.0        Use the calculated Patient Ratio above and the CHD Risk Table to determine the patient's CHD Risk.        ATP III CLASSIFICATION (LDL):  <100     mg/dL   Optimal  100-129  mg/dL   Near or Above                    Optimal  130-159  mg/dL   Borderline  160-189  mg/dL   High  >190     mg/dL   Very High   Glucose, capillary     Status: Abnormal   Collection Time: 11/07/17  6:52 AM  Result Value Ref Range   Glucose-Capillary 39 (LL) 65 - 99 mg/dL   Comment 1 Notify RN    Comment 2 Document in Chart   Troponin I (q 6hr x 3)     Status: Abnormal   Collection Time: 11/07/17  7:31 AM  Result Value Ref Range   Troponin I 0.37 (HH) <0.03 ng/mL    Comment: CRITICAL VALUE NOTED.  VALUE IS CONSISTENT WITH PREVIOUSLY REPORTED AND CALLED VALUE.  Basic metabolic panel     Status: Abnormal   Collection Time: 11/07/17  7:31 AM  Result Value Ref Range   Sodium 145 135 - 145 mmol/L   Potassium 4.2 3.5 - 5.1 mmol/L   Chloride 108 101 - 111 mmol/L   CO2 25 22 - 32 mmol/L   Glucose, Bld 121 (H) 65 - 99 mg/dL   BUN 39 (H) 6 - 20 mg/dL   Creatinine, Ser 1.58 (H) 0.61 - 1.24 mg/dL   Calcium 8.5 (L) 8.9 - 10.3 mg/dL   GFR calc non Af Amer 39 (L) >60 mL/min   GFR calc Af Amer 45 (L) >60 mL/min    Comment: (NOTE) The eGFR has been calculated using the CKD EPI  equation. This calculation has not been validated in all clinical situations. eGFR's persistently <60 mL/min signify possible Chronic Kidney Disease.    Anion gap 12 5 - 15  Glucose, capillary     Status: Abnormal   Collection Time: 11/07/17  8:05 AM  Result Value Ref Range   Glucose-Capillary 45 (L) 65 - 99 mg/dL  Magnesium     Status: None   Collection Time: 11/07/17  8:43 AM  Result Value Ref Range   Magnesium 2.0 1.7 - 2.4 mg/dL  Glucose, capillary     Status: None   Collection Time:  11/07/17  8:52 AM  Result Value Ref Range   Glucose-Capillary 80 65 - 99 mg/dL   Comment 1 Notify RN    Comment 2 Document in Chart   Cortisol     Status: None   Collection Time: 11/07/17 10:21 AM  Result Value Ref Range   Cortisol, Plasma 15.9 ug/dL    Comment: (NOTE) AM    6.7 - 22.6 ug/dL PM   <10.0       ug/dL   Glucose, capillary     Status: None   Collection Time: 11/07/17 10:57 AM  Result Value Ref Range   Glucose-Capillary 86 65 - 99 mg/dL   Comment 1 Notify RN    Comment 2 Document in Chart   Glucose, capillary     Status: Abnormal   Collection Time: 11/07/17 12:23 PM  Result Value Ref Range   Glucose-Capillary 56 (L) 65 - 99 mg/dL   Comment 1 Notify RN    Comment 2 Document in Chart   Blood gas, arterial     Status: Abnormal (Preliminary result)   Collection Time: 11/07/17 12:55 PM  Result Value Ref Range   O2 Content 4.0 L/min   Delivery systems NASAL CANNULA    pH, Arterial 7.358 7.350 - 7.450   pCO2 arterial 49.5 (H) 32.0 - 48.0 mmHg   pO2, Arterial 107 83.0 - 108.0 mmHg   Bicarbonate 27.1 20.0 - 28.0 mmol/L   Acid-Base Excess 2.2 (H) 0.0 - 2.0 mmol/L   O2 Saturation PENDING %   Collection site RIGHT BRACHIAL    Drawn by [phone]    Sample type ARTERIAL   Glucose, capillary     Status: Abnormal   Collection Time: 11/07/17  1:00 PM  Result Value Ref Range   Glucose-Capillary 46 (L) 65 - 99 mg/dL   Comment 1 Notify RN    Comment 2 Document in Chart     Dg Chest 2  View  Result Date: 11/06/2017 CLINICAL DATA:  Shortness of breath for 3 weeks. EXAM: CHEST  2 VIEW COMPARISON:  November 02, 2017 FINDINGS: Mediastinal contour is normal. The heart size is mildly enlarged. There is central pulmonary vascular congestion. There is no focal pneumonia. Minimal bilateral pleural effusions are noted. The visualized skeletal structures are unremarkable. IMPRESSION: Mild cardiomegaly with central pulmonary vascular congestion. Minimal bilateral pleural effusions. Electronically Signed   By: Abelardo Diesel M.D.   On: 11/06/2017 16:24   Ct Head Wo Contrast  Result Date: 11/06/2017 CLINICAL DATA:  Mental status changes and generalize weakness for 2 days. EXAM: CT HEAD WITHOUT CONTRAST TECHNIQUE: Contiguous axial images were obtained from the base of the skull through the vertex without intravenous contrast. COMPARISON:  03/02/2010 FINDINGS: Brain: Progressive age related cerebral atrophy, ventriculomegaly and periventricular white matter disease. Remote lacunar type basal ganglia infarcts are noted. No CT findings for acute hemispheric infarction or intracranial hemorrhage. No extra-axial fluid collections are identified. The brainstem and cerebellum are grossly normal. Vascular: Stable advanced vascular calcifications but no hyperdense vessels or obvious aneurysm. Skull: No acute bony findings or skull fracture. Sinuses/Orbits: The paranasal sinuses and mastoid air cells are clear except for right maxillary sinus disease. The globes are intact. Other: No scalp lesion or hematoma. IMPRESSION: 1. Progressive age related cerebral atrophy, ventriculomegaly and periventricular white matter disease. 2. No acute intracranial findings or mass lesions. 3. No acute bony findings. Electronically Signed   By: Marijo Sanes M.D.   On: 11/06/2017 17:26   Mr Brain Wo Contrast  Result Date: 11/07/2017 CLINICAL DATA:  Shortness of breath, confusion, gait imbalance. EXAM: MRI HEAD WITHOUT CONTRAST  TECHNIQUE: Multiplanar, multiecho pulse sequences of the brain and surrounding structures were obtained without intravenous contrast. COMPARISON:  CT HEAD November 06, 2017 at 1713 hours FINDINGS: Multiple sequences are moderately motion degraded. BRAIN: No reduced diffusion to suggest acute ischemia. Susceptibility artifact RIGHT basal ganglia associated with old infarct. No susceptibility artifact to suggest recent hemorrhage. Old small LEFT cerebellar and multiple pontine lacunar infarcts. Prominent bilateral basal ganglia and thalami perivascular spaces associated with chronic small vessel ischemic disease. Subcentimeter LEFT frontal periventricular subependymal or neural glial cyst. Mild ex vacuo dilatation RIGHT lateral ventricle, moderate global parenchymal brain volume loss without hydrocephalus. Patchy to confluent supratentorial white matter FLAIR T2 hyperintensities. No suspicious parenchymal signal, mass or mass effect. No abnormal extra-axial fluid collections. VASCULAR: Normal major intracranial vascular flow voids present at skull base. SKULL AND UPPER CERVICAL SPINE: No abnormal sellar expansion. No suspicious calvarial bone marrow signal. Craniocervical junction maintained. SINUSES/ORBITS: Moderate paranasal sinus mucosal thickening. Imaged mastoid air cells are well aerated. The included ocular globes and orbital contents are non-suspicious. OTHER: None. IMPRESSION: 1. No acute intracranial process on this moderately motion degraded examination. 2. Moderate to severe chronic small vessel ischemic disease and multiple old small infarcts. Electronically Signed   By: Elon Alas M.D.   On: 11/07/2017 00:18   Dg Chest Port 1 View  Result Date: 11/07/2017 CLINICAL DATA:  Shortness of breath, history CHF, hypertension, MI, chronic kidney disease EXAM: PORTABLE CHEST 1 VIEW COMPARISON:  Portable exam 1149 hours compared to 11/06/2017 FINDINGS: Enlargement of cardiac silhouette. Atherosclerotic  calcification aorta. Slight pulmonary vascular congestion. Scarring in RIGHT upper lobe with mild bibasilar atelectasis. No definite acute infiltrate, pleural effusion or pneumothorax. Bones demineralized. IMPRESSION: Enlargement of cardiac silhouette with pulmonary vascular congestion. Mild bibasilar atelectasis. Electronically Signed   By: Lavonia Dana M.D.   On: 11/07/2017 12:20    Review of Systems  Constitutional: Positive for malaise/fatigue.  HENT: Negative.   Eyes: Negative.   Respiratory: Positive for shortness of breath. Negative for cough.   Cardiovascular: Positive for leg swelling. Negative for chest pain, palpitations, orthopnea and PND.  Gastrointestinal: Negative for abdominal pain, nausea and vomiting.  Genitourinary: Negative.   Musculoskeletal: Negative.   Skin:       Leg skin abrasions  Neurological: Positive for weakness. Negative for dizziness and loss of consciousness.       Episodes of confusion  Psychiatric/Behavioral: Negative.   All other systems reviewed and are negative.  Blood pressure 97/69, pulse (!) 130, temperature 97.7 F (36.5 C), temperature source Axillary, resp. rate (!) 32, height 5' 6"  (1.676 m), weight 66.5 kg (146 lb 9.7 oz), SpO2 (!) 87 %. Physical Exam  Nursing note and vitals reviewed. Constitutional: He is oriented to person, place, and time. He appears well-developed and well-nourished. No distress.  HENT:  Head: Normocephalic and atraumatic.  Eyes: Pupils are equal, round, and reactive to light.  Neck: Normal range of motion. Neck supple. JVD present.  Cardiovascular: Normal rate and regular rhythm. Exam reveals no gallop.  Murmur heard. Diminished distal pulses, likely limited due to edema  Respiratory: Effort normal. He has no wheezes. He has rales (Bibasilar rales). He exhibits no tenderness.  GI: Soft. Bowel sounds are normal. He exhibits distension. There is no tenderness.  Musculoskeletal: Normal range of motion. He exhibits edema  (Tace bilateral).  Neurological: He is alert and oriented to person, place,  and time.  Skin: Skin is warm and dry.  Superficial abrasions bilateral shins   Psychiatric: He has a normal mood and affect.    Assessment:  84 year old African-American male   Exrtional dyspnea, likely acute on chronic, likely systolic heart failure. Elevated BNP Paroxismal atrial flutter CHA2DS2VASc score 4  4.8% annual stroke risk Troponin elevation: Likely type II MI in the setting of heart failure Acute kidney injury: Likely cardiorenal syndrome Tobacco abuse  Recommendations: Recommend IV lasix 40 mg q8hr IV.Marland Kitchen Echocardiogram  Recommend heparin for anticoagulation the setting of paroxysmal atrial flutter.  Currently in sinus rhythm.  If recurrence of atrial flutter, given borderline low systolic blood pressure, would recommend amiodarone for rate and rhythm control. Continue aspirin, statin. Continue metoprolol tartarate 25 mg bid for now. Will convert to succinate prior to discharge.  He will likely need right and left heart catheter with coronary angiography at some point to establish etiology of suspected heart failure.  Will await improvement in creatinine before perform coronary angiography.  Dvid Pendry J Raydan Schlabach 11/07/2017, 1:28 PM    Katelind Pytel Esther Hardy, MD [facility] Pager: [phone] Office: [phone] If no answer Cell [phone]

## 2017-11-07 NOTE — Progress Notes (Signed)
CBG 56. OJ given. Recheck= 46. Glutose 15 gel given. Will recheck in 15 min

## 2017-11-07 NOTE — Progress Notes (Signed)
Patient troponin level was 0.44, Lactic acid level was 2.3, doctor on call notified no new order but keep monitoring.

## 2017-11-07 NOTE — Progress Notes (Signed)
Dr. Gonzella Lexhungel at bedside, assessing patient. EKG given for review.

## 2017-11-07 NOTE — Progress Notes (Signed)
  Echocardiogram 2D Echocardiogram has been performed.  Delcie RochENNINGTON, Kennard Fildes 11/07/2017, 5:45 PM

## 2017-11-07 NOTE — Progress Notes (Signed)
Received call from Dr. Gonzella Lexhungel. Stated change in patient's condition, heart rhythm. EKG preliminary states SVT. Dr Gonzella Lexhungel stated he will come.

## 2017-11-07 NOTE — Progress Notes (Signed)
Patient blood sugar this morning was 39, doctor on call notified stat D50 given, patient given orange juice and milk. New order given for Blood glucose check q6hr.

## 2017-11-07 NOTE — Progress Notes (Signed)
Patient transported to 2C07. Report concluded at bedside with Greta DoomGeorgie, RN.

## 2017-11-07 NOTE — Progress Notes (Signed)
CBG= 46, glutose gel given again. Will continue to recheck. MD paged.

## 2017-11-07 NOTE — Progress Notes (Signed)
ANTICOAGULATION CONSULT NOTE  Pharmacy Consult for heparin Indication: atrial fibrillation  Heparin Dosing Weight: 66.5 kg   Assessment: 84 yof with new afib. Pharmacy consulted to dose heparin. Not on anticoagulation PTA. Hg wnl, plt 136. Noted frequent falls at home per MD note. CT and MRI brain negative for acute findings. No bleeding documented.  Previously on Lovenox for VTE prophylaxis - last dose 1/13 at 2233.  Evening heparin level is therapeutic.  Goal of Therapy:  Heparin level 0.3-0.7 units/ml Monitor platelets by anticoagulation protocol: Yes   Plan:  Continue heparin at 950 units/hr Daily heparin level/CBC Monitor s/sx bleeding  Baldemar FridayMasters, Aemon Koeller M  11/07/2017 11:42 PM

## 2017-11-07 NOTE — Progress Notes (Addendum)
EKG done at bedside. Preliminary unconfirmed results show SVT with RVR. Patient instructed to cough and deep breath. Rhythm changed to SR, then AF. When in SR, patient noted to have frequent PACs. When in rapid rate, patient has drop in BP.  Bedside RN stated attempt to call MD with no response. Will call office.

## 2017-11-07 NOTE — Progress Notes (Signed)
ANTICOAGULATION CONSULT NOTE  Pharmacy Consult for heparin Indication: atrial fibrillation  Heparin Dosing Weight: 66.5 kg  Labs: Recent Labs    11/06/17 1542 11/06/17 1651 11/06/17 1935 11/07/17 0152 11/07/17 0731  HGB 15.6  --   --   --   --   HCT 47.1  --   --   --   --   PLT 136*  --   --   --   --   LABPROT  --   --  15.2  --   --   INR  --   --  1.21  --   --   CREATININE  --  1.76*  --   --  1.58*  TROPONINI  --   --  0.29* 0.44* 0.37*    Assessment: [age] yof with new afib. Pharmacy consulted to dose heparin. Not on anticoagulation PTA. Hg wnl, plt 136. Noted frequent falls at home per MD note. CT and MRI brain negative for acute findings. No bleeding documented.  Previously on Lovenox for VTE prophylaxis - last dose 1/13 at 2233.  Goal of Therapy:  Heparin level 0.3-0.7 units/ml Monitor platelets by anticoagulation protocol: Yes   Plan:  D/c Lovenox Heparin 1500 unit bolus - smaller bolus with recent Lovenox dose Start heparin at 950 units/h 8h heparin level Daily heparin level/CBC Monitor s/sx bleeding   Babs BertinHaley Chen Holzman, PharmD, BCPS Clinical Pharmacist 11/07/2017 1:53 PM

## 2017-11-07 NOTE — Progress Notes (Signed)
Received call from Dr. Gonzella Lexhungel. Notified him of CBG results. Also notified we have received a room assignment for SD. He stated he will place new orders.

## 2017-11-07 NOTE — Progress Notes (Signed)
Called Dr. Valora Piccolohungel's office. Notified that we have attempted to reach MD four times without response. Notified change in patient's condition. Office stated they will call Dr. Gonzella Lexhungel on his cell phone.

## 2017-11-07 NOTE — Progress Notes (Signed)
Received call from 3W stating need some assistance with patient with heart rhythm change. Nurse stated MD paged already, awaiting response.

## 2017-11-07 NOTE — Progress Notes (Signed)
BG checked again; resulted at 51.Marland Kitchen..MD text paged; not sure this is accurate; pt asymptomatic; finger tips extremely tough and look like they've been stuck too many times; pt drank 2 OJ's and then an AJ (per pt request) right before BG of 51. Will continue to monitor. MD called back and verbally ordered a STAT BMET.   [place]  Gredmarie Delange, RN

## 2017-11-07 NOTE — Progress Notes (Signed)
Cbg= 99. Called report to [place] on 2C. Pt taken down with belongings via bed by swat nurse.

## 2017-11-07 NOTE — Progress Notes (Signed)
PT Cancellation Note  Patient Details Name: Tessa LernerBobby Oldaker MRN: [id number] DOB: 08/02/1933   Cancelled Treatment:    Reason Eval/Treat Not Completed: Patient not medically ready. Pt with noted elevated troponin and now rapid response called in. PT to return as able once appropriate to complete PT eval.   Linnette Panella M Roux Brandy 11/07/2017, 11:05 AM   Lewis ShockAshly Aune Adami, PT, DPT Pager #: [phone] Office #: [phone]

## 2017-11-07 NOTE — Progress Notes (Signed)
Dr. Oren SectionPatwardian, cardiologist, at bedside. Assessed patient. Family at bedside.

## 2017-11-07 NOTE — Consult Note (Signed)
WOC Nurse wound consult note Reason for Consult: Consult requested for bilat knees. Pt fell prior to admission and has 2 full thickness abrasions. Measurement: Left knee; 1X1X.2cm Right knee 1X1.5X.2cm; both are dry and dark red, no odor, drainage, or fluctuance. Periwound: Intact skin surrounding Dressing procedure/placement/frequency: Bactroban to promote moist healing.  Foam dressings to protect from further injury.  No family present to discuss plan of care. Please re-consult if further assistance is needed.  Thank-you,  Cammie Mcgeeawn Donnesha Karg MSN, RN, CWOCN, VandiverWCN-AP, CNS [phone]

## 2017-11-07 NOTE — Progress Notes (Signed)
[employer]Called Hospitalist office and spoke with Melissa. Notified her paged Dr Gonzella Lexhungel twice with no response to notify of low blood sugars despite interventions. She stated she will send him a test page and call his cell phone.

## 2017-11-07 NOTE — Progress Notes (Addendum)
Patient hypoglycemic despite several attempts to raise blood sugar with food and glucose gel. Paged Dr. Gonzella Lexhungel to notify.

## 2017-11-07 NOTE — Progress Notes (Signed)
Arrived at bedside. Patient states he feels fine, no difference. Does not appear in distress. Assessed patient. Lungs clear with mild congestion on right. Patient on 4L/min O2 via [place], with mild work of breathing using accessory muscles. Reviewed chest xray, mild congestion. Placed patient on bedside monitor. Patient in and out of Rapid narrow complex tachycardia.

## 2017-11-07 NOTE — Care Management Note (Signed)
Case Management Note  Patient Details  Name: [name] [name] MRN: [id number] Date of Birth: 05/27/1933  Subjective/Objective:       Pt in with acute CHF. He is from home alone.              Action/Plan: Awaiting PT/OT recommendations. CM following for d/c needs, physician orders.   Expected Discharge Date:                  Expected Discharge Plan:     In-House Referral:     Discharge planning Services     Post Acute Care Choice:    Choice offered to:     DME Arranged:    DME Agency:     HH Arranged:    HH Agency:     Status of Service:  In process, will continue to follow  If discussed at Long Length of Stay Meetings, dates discussed:    Additional Comments:  Kermit BaloKelli F Heavenly Christine, RN 11/07/2017, 10:37 AM

## 2017-11-07 NOTE — Progress Notes (Signed)
Order received to transfer patient to SD when bed available.

## 2017-11-07 NOTE — Progress Notes (Signed)
CCMD called and said pt's heart rate was in the 130's. On assessment, pt said he felt fine but his VS was BP  156/134.  T. 97.7.  P 95.  Resp 15.  02 = 49%, gave 4L 02, [place]  Paged MD 4x.  Pulse rate dropped to 27 and heart rate up in the 150's  Rapid Response called, they were in another Rapid so they said to call the ICU swat nurse. They arrived. CBG= 86.  Ekg= SVT. MD arrived and put in orders for pt to go to step down unit. Will continue to monitor until pt is on another floor.

## 2017-11-07 NOTE — Progress Notes (Signed)
Pt's HR inc to 130's ST after initial assessment but by the time metoprolol collected pt's HR back down to 70's sinus.

## 2017-11-07 NOTE — Progress Notes (Addendum)
OT Cancellation Note  Patient Details Name: [name] [name] MRN: [id number] DOB: 10/16/1933   Cancelled Treatment:    Reason Eval/Treat Not Completed: Other (comment); spoke with RN, Pt with low blood sugars, elevated troponin this AM and asking to hold therapy; will check back later today as schedule permits.  Marcy SirenBreanna Khalin Royce, OT Pager [phone] 11/07/2017   [name] [name] 11/07/2017, 10:07 AM

## 2017-11-07 NOTE — Progress Notes (Addendum)
PROGRESS NOTE                                                                                                                                                                                                             Patient Demographics:    [name] [name], is a 84 y.o. male, DOB - 1933/06/18, LOV:[id number]  Admit date - 11/06/2017   Admitting Physician Lorretta Harp, MD  Outpatient Primary MD for the patient is  Dr Jolene Provost  LOS - 1  Outpatient Specialists: None  Chief Complaint  Patient presents with  . Weakness       Brief Narrative   84 year old male with hypertension, hypothyroidism, CAD with history of MI (status post stent placement per report), CK ED stage II, tobacco abuse presented with shortness of breath, confusion, poor balance with fall at home and diarrhea. Reportedly patient progressively short of breath for past 2 months along with nonproductive cough. He was seen by his PCP and diagnosed with new onset CHF started on Lasix last week and referred to cardiologist.. Also having intermittent diarrhea in the past 3 weeks. He was found by his sister to have poor balance and had a fall one week back without loss of consciousness. In the ED he was hypoxic with O2 sat of 84% on room air. he had mildly elevated troponin of 0.13, BNP of 2000, elevated lactic acid of 2.99 and mildly elevated transaminases along with acute kidney injury. Chest x-ray showed pulmonary vascular congestion and bilateral small pleural effusion. Head CT negative for acute findings.     Subjective:   Patient became hypoglycemic to the 40s this morning and given D50 and morning meal after which it improved to 80. Patient appeared confused and was only oriented to place. Says he's 34 and lives with his parents.  Assessment  & Plan :    Principal Problem:   Acute diastolic (congestive) heart failure (HCC) Appears euvolemic on my exam  this morning. Holding further diuretic due to his renal function. Follow 2-D echo. TSH is normal was free T4 is mildly elevated. Monitor strict I/O and daily weight. Cardiology consult pending.  Acute respiratory failure with hypoxia ( HCC) Patient's o2 sat fluctuating from high 90s dropping to 80s. Repeat CXR and check ABG. Currently on 4L via [place]. IV lasix 40 mg x1 ordered. Transfer to  stepdown for closer monitoring.   Active Problems: SVTs  HR elevated to 150s then returning back to sinus. TSH and mg normal. follow echo. Prn IV metoprolol.   Elevated troponin level Patient denies chest pain but troponin progressively elevated in a.m. lab. Noted to be tachycardic this morning as well. Continue to cycle enzymes, follow 2-D echo and continue metoprolol. Septal ST changes noted on EKG compared to prior. Cardiology consult pending. Added aspirin and Lipitor.  Acute metabolic encephalopathy No signs of infection. Per sister and cousin at bedside, patient acutely confused since past 2 days, hallucinating and seeing people trying to kill him.  Patient hypoglycemic this morning. No history of diabetes and A1c of 6.3. Monitor CBG every 2 hours. TSH, B12, ammonia and UA normal. MRI brain shows old ischemic changes.  Will verify with family on possible underlying progressive dementia. Follow urine and blood culture. Check HIV, RPR and urine drug screen. Psych eval if w/up negative.  Hypoglycemia No history of diabetes. A1c of 6.3. Monitor CBG every 2 hours. Check a.m. cortisol.    Acute renal failure superimposed on stage 2 chronic kidney disease (HCC) Monitor renal function for now and avoid nephrotoxins.  Transaminitis Follow hepatitis panel.    Hypothyroidism Not on medications. Normal TSH with mildly elevated free T4.    TOBACCO ABUSE Nicotine patch ordered.    HYPERTENSION, BENIGN ESSENTIAL  Continue beta blocker.  Prolonged QT interval  magnesium normal . Avoid QT prolonging  agents.  Frequent falls at home CT head and MRI brain negative for acute findings. B12, TSH normal. Right knee wound from recent fall. Wound care consult appreciated. Check orthostasis, HIV and RPR. PT evaluation.      Code Status : Full code  Family Communication  :sister and cousin at bedside  Disposition Plan  : Pending hospital course  Barriers For Discharge : Active symptoms  Consults  :  Cardiology  Procedures  :  CT head, MRI brain 2-D echo  DVT Prophylaxis  :  Lovenox -  Lab Results  Component Value Date   PLT 136 (L) 11/06/2017    Antibiotics  :    Anti-infectives (From admission, onward)   None        Objective:   Vitals:   11/07/17 0042 11/07/17 0221 11/07/17 0423 11/07/17 0922  BP: 122/80 115/81 123/73   Pulse: 74 74 79   Resp: 18 18 18    Temp:   (!) 97.4 F (36.3 C)   TempSrc:   Oral   SpO2: 100% 98% 98% 98%  Weight:      Height:        Wt Readings from Last 3 Encounters:  11/06/17 66.5 kg (146 lb 9.7 oz)  06/23/17 68.9 kg (152 lb)  04/12/17 67.8 kg (149 lb 8 oz)     Intake/Output Summary (Last 24 hours) at 11/07/2017 0953 Last data filed at 11/07/2017 0853 Gross per 24 hour  Intake 360 ml  Output 525 ml  Net -165 ml     Physical Exam  Gen: not in distress, confused HEENT: no pallor, moist mucosa, supple neck, no JVD Chest: Diminished breath sounds over lung bases, no added sounds CVS: N S1&S2, no murmurs, rubs or gallop GI: soft, NT, ND, BS+ Musculoskeletal: warm, no edema, vision or bilaterally CNS: AAOX1-2, nonfocal    Data Review:    CBC Recent Labs  Lab 11/06/17 1542  WBC 11.1*  HGB 15.6  HCT 47.1  PLT 136*  MCV 96.5  MCH 32.0  MCHC 33.1  RDW 15.8*    Chemistries  Recent Labs  Lab 11/06/17 1651  NA 144  K 4.8  CL 108  CO2 25  GLUCOSE 123*  BUN 42*  CREATININE 1.76*  CALCIUM 9.0  AST 78*  ALT 59  ALKPHOS 110  BILITOT 1.1    ------------------------------------------------------------------------------------------------------------------ Recent Labs    11/07/17 0201  CHOL 149  HDL 42  LDLCALC 94  TRIG 64  CHOLHDL 3.5    Lab Results  Component Value Date   HGBA1C 6.3 (H) 11/07/2017   ------------------------------------------------------------------------------------------------------------------ Recent Labs    11/07/17 0152  TSH 1.340   ------------------------------------------------------------------------------------------------------------------ Recent Labs    11/06/17 1935  VITAMINB12 931*    Coagulation profile Recent Labs  Lab 11/06/17 1935  INR 1.21    No results for input(s): DDIMER in the last 72 hours.  Cardiac Enzymes Recent Labs  Lab 11/06/17 1935 11/07/17 0152  TROPONINI 0.29* 0.44*   ------------------------------------------------------------------------------------------------------------------    Component Value Date/Time   BNP 2,055.2 (H) 11/06/2017 1554    Inpatient Medications  Scheduled Meds: . aspirin  324 mg Oral Daily  . atorvastatin  40 mg Oral q1800  . dextrose      . enoxaparin (LOVENOX) injection  40 mg Subcutaneous Q24H  . ipratropium-albuterol  3 mL Nebulization TID  . metoprolol tartrate  25 mg Oral BID  . mupirocin cream   Topical Daily  . nicotine  21 mg Transdermal Daily  . sodium chloride flush  3 mL Intravenous Q12H   Continuous Infusions: . sodium chloride     PRN Meds:.sodium chloride, albuterol, dextromethorphan-guaiFENesin, hydrALAZINE, morphine injection, nitroGLYCERIN, ondansetron (ZOFRAN) IV, sodium chloride flush, zolpidem  Micro Results Recent Results (from the past 240 hour(s))  Culture, blood (Routine X 2) w Reflex to ID Panel     Status: None (Preliminary result)   Collection Time: 11/06/17  7:57 PM  Result Value Ref Range Status   Specimen Description BLOOD RIGHT HAND  Final   Special Requests IN PEDIATRIC  BOTTLE Blood Culture adequate volume  Final   Culture PENDING  Incomplete   Report Status PENDING  Incomplete    Radiology Reports Dg Chest 2 View  Result Date: 11/06/2017 CLINICAL DATA:  Shortness of breath for 3 weeks. EXAM: CHEST  2 VIEW COMPARISON:  November 02, 2017 FINDINGS: Mediastinal contour is normal. The heart size is mildly enlarged. There is central pulmonary vascular congestion. There is no focal pneumonia. Minimal bilateral pleural effusions are noted. The visualized skeletal structures are unremarkable. IMPRESSION: Mild cardiomegaly with central pulmonary vascular congestion. Minimal bilateral pleural effusions. Electronically Signed   By: Sherian Rein M.D.   On: 11/06/2017 16:24   Ct Head Wo Contrast  Result Date: 11/06/2017 CLINICAL DATA:  Mental status changes and generalize weakness for 2 days. EXAM: CT HEAD WITHOUT CONTRAST TECHNIQUE: Contiguous axial images were obtained from the base of the skull through the vertex without intravenous contrast. COMPARISON:  03/02/2010 FINDINGS: Brain: Progressive age related cerebral atrophy, ventriculomegaly and periventricular white matter disease. Remote lacunar type basal ganglia infarcts are noted. No CT findings for acute hemispheric infarction or intracranial hemorrhage. No extra-axial fluid collections are identified. The brainstem and cerebellum are grossly normal. Vascular: Stable advanced vascular calcifications but no hyperdense vessels or obvious aneurysm. Skull: No acute bony findings or skull fracture. Sinuses/Orbits: The paranasal sinuses and mastoid air cells are clear except for right maxillary sinus disease. The globes are intact. Other: No scalp lesion or hematoma. IMPRESSION: 1. Progressive age  related cerebral atrophy, ventriculomegaly and periventricular white matter disease. 2. No acute intracranial findings or mass lesions. 3. No acute bony findings. Electronically Signed   By: Rudie Meyer M.D.   On: 11/06/2017 17:26    Mr Brain Wo Contrast  Result Date: 11/07/2017 CLINICAL DATA:  Shortness of breath, confusion, gait imbalance. EXAM: MRI HEAD WITHOUT CONTRAST TECHNIQUE: Multiplanar, multiecho pulse sequences of the brain and surrounding structures were obtained without intravenous contrast. COMPARISON:  CT HEAD November 06, 2017 at 1713 hours FINDINGS: Multiple sequences are moderately motion degraded. BRAIN: No reduced diffusion to suggest acute ischemia. Susceptibility artifact RIGHT basal ganglia associated with old infarct. No susceptibility artifact to suggest recent hemorrhage. Old small LEFT cerebellar and multiple pontine lacunar infarcts. Prominent bilateral basal ganglia and thalami perivascular spaces associated with chronic small vessel ischemic disease. Subcentimeter LEFT frontal periventricular subependymal or neural glial cyst. Mild ex vacuo dilatation RIGHT lateral ventricle, moderate global parenchymal brain volume loss without hydrocephalus. Patchy to confluent supratentorial white matter FLAIR T2 hyperintensities. No suspicious parenchymal signal, mass or mass effect. No abnormal extra-axial fluid collections. VASCULAR: Normal major intracranial vascular flow voids present at skull base. SKULL AND UPPER CERVICAL SPINE: No abnormal sellar expansion. No suspicious calvarial bone marrow signal. Craniocervical junction maintained. SINUSES/ORBITS: Moderate paranasal sinus mucosal thickening. Imaged mastoid air cells are well aerated. The included ocular globes and orbital contents are non-suspicious. OTHER: None. IMPRESSION: 1. No acute intracranial process on this moderately motion degraded examination. 2. Moderate to severe chronic small vessel ischemic disease and multiple old small infarcts. Electronically Signed   By: Awilda Metro M.D.   On: 11/07/2017 00:18    Time Spent in minutes  35   Jude Linck M.D on 11/07/2017 at 9:53 AM  Between 7am to 7pm - Pager - [phone]  After 7pm go to  [email or web address] - password [facility]  [facility] -  Office  [phone]

## 2017-11-08 DIAGNOSIS — R945 Abnormal results of liver function studies: Secondary | ICD-10-CM

## 2017-11-08 DIAGNOSIS — R262 Difficulty in walking, not elsewhere classified: Secondary | ICD-10-CM

## 2017-11-08 DIAGNOSIS — R197 Diarrhea, unspecified: Secondary | ICD-10-CM

## 2017-11-08 DIAGNOSIS — I5023 Acute on chronic systolic (congestive) heart failure: Secondary | ICD-10-CM

## 2017-11-08 DIAGNOSIS — R531 Weakness: Secondary | ICD-10-CM

## 2017-11-08 DIAGNOSIS — E872 Acidosis: Secondary | ICD-10-CM

## 2017-11-08 LAB — BASIC METABOLIC PANEL
ANION GAP: 11 (ref 5–15)
BUN: 39 mg/dL — ABNORMAL HIGH (ref 6–20)
CHLORIDE: 101 mmol/L (ref 101–111)
CO2: 29 mmol/L (ref 22–32)
Calcium: 8.2 mg/dL — ABNORMAL LOW (ref 8.9–10.3)
Creatinine, Ser: 1.7 mg/dL — ABNORMAL HIGH (ref 0.61–1.24)
GFR calc non Af Amer: 36 mL/min — ABNORMAL LOW (ref >60)
GFR, EST AFRICAN AMERICAN: 41 mL/min — AB (ref >60)
Glucose, Bld: 74 mg/dL (ref 65–99)
Potassium: 4.1 mmol/L (ref 3.5–5.1)
Sodium: 141 mmol/L (ref 135–145)

## 2017-11-08 LAB — BLOOD GAS, ARTERIAL
Acid-Base Excess: 2.2 mmol/L — ABNORMAL HIGH (ref 0.0–2.0)
Bicarbonate: 27.1 mmol/L (ref 20.0–28.0)
DRAWN BY: 33176
O2 Content: 4 L/min
O2 Saturation: 97 %
PCO2 ART: 49.5 mmHg — AB (ref 32.0–48.0)
pH, Arterial: 7.358 (ref 7.350–7.450)
pO2, Arterial: 107 mmHg (ref 83.0–108.0)

## 2017-11-08 LAB — GLUCOSE, CAPILLARY
GLUCOSE-CAPILLARY: 95 mg/dL (ref 65–99)
GLUCOSE-CAPILLARY: 93 mg/dL (ref 65–99)
GLUCOSE-CAPILLARY: 113 mg/dL — AB (ref 65–99)
GLUCOSE-CAPILLARY: 103 mg/dL — AB (ref 65–99)
Glucose-Capillary: 17 mg/dL — CL (ref 65–99)

## 2017-11-08 LAB — CBC
HCT: 42.2 % (ref 39.0–52.0)
Hemoglobin: 13.6 g/dL (ref 13.0–17.0)
MCH: 31.7 pg (ref 26.0–34.0)
MCHC: 32.2 g/dL (ref 30.0–36.0)
MCV: 98.4 fL (ref 78.0–100.0)
Platelets: 130 10*3/uL — ABNORMAL LOW (ref 150–400)
RBC: 4.29 MIL/uL (ref 4.22–5.81)
RDW: 15.3 % (ref 11.5–15.5)
WBC: 8 10*3/uL (ref 4.0–10.5)

## 2017-11-08 LAB — URINE CULTURE: CULTURE: NO GROWTH

## 2017-11-08 LAB — HEPARIN LEVEL (UNFRACTIONATED): Heparin Unfractionated: 0.55 IU/mL (ref 0.30–0.70)

## 2017-11-08 LAB — UREA NITROGEN, URINE: Urea Nitrogen, Ur: 252 mg/dL

## 2017-11-08 LAB — RPR, QUANT+TP ABS (REFLEX): TREPONEMA PALLIDUM AB: POSITIVE — AB

## 2017-11-08 LAB — HIV ANTIBODY (ROUTINE TESTING W REFLEX): HIV Screen 4th Generation wRfx: NONREACTIVE

## 2017-11-08 LAB — RPR: RPR Ser Ql: REACTIVE — AB

## 2017-11-08 LAB — MRSA PCR SCREENING: MRSA BY PCR: NEGATIVE

## 2017-11-08 MED ORDER — FUROSEMIDE 40 MG PO TABS
40.0000 mg | ORAL_TABLET | Freq: Two times a day (BID) | ORAL | Status: DC
  Administered 2017-11-08 (×2): 40 mg via ORAL
  Filled 2017-11-08 (×2): qty 1

## 2017-11-08 MED ORDER — ISOSORB DINITRATE-HYDRALAZINE 20-37.5 MG PO TABS
0.5000 | ORAL_TABLET | Freq: Three times a day (TID) | ORAL | Status: DC
  Administered 2017-11-08 – 2017-11-11 (×10): 0.5 via ORAL
  Filled 2017-11-08 (×3): qty 1
  Filled 2017-11-08: qty 0.5
  Filled 2017-11-08 (×8): qty 1

## 2017-11-08 MED ORDER — FUROSEMIDE 10 MG/ML IJ SOLN
40.0000 mg | Freq: Two times a day (BID) | INTRAMUSCULAR | Status: DC
  Administered 2017-11-08 – 2017-11-09 (×2): 40 mg via INTRAVENOUS
  Filled 2017-11-08 (×2): qty 4

## 2017-11-08 NOTE — Evaluation (Signed)
Occupational Therapy Evaluation Patient Details Name: [name] LernerBobby [name] MRN: [id number] DOB: 11/15/1932 Today's Date: 11/08/2017    History of Present Illness Pt is a 84 yo male admitted iwth SOB, confusion and poor balance. Pt found to have a new CHF exacerbation.  Pt iwth h/o CAD, HTN, MI, CKD and SOB.     Clinical Impression   Pt admitted with the above diagnosis and has the deficits listed below. Pt would benefit from cont OT to increase independence in basic balance during adls in standing so he can eventually return home alone. Pt has family present but none that were willing to say if 24/7 S was available at this time.  Pt is a fall risk at current level. If 24/7 assist available, pt could possibly d/c home with HHOT.    Follow Up Recommendations  SNF;Supervision/Assistance - 24 hour    Equipment Recommendations  3 in 1 bedside commode    Recommendations for Other Services       Precautions / Restrictions Precautions Precautions: Fall Precaution Comments: Pt reports one fall before admission. Restrictions Weight Bearing Restrictions: No      Mobility Bed Mobility Overal bed mobility: Needs Assistance Bed Mobility: Supine to Sit     Supine to sit: Min guard     General bed mobility comments: pt used bedrail  Transfers Overall transfer level: Needs assistance Equipment used: 1 person hand held assist Transfers: Sit to/from [employer] CorporationStand;Stand Pivot Transfers Sit to Stand: Min guard Stand pivot transfers: Min assist       General transfer comment: Pt generally unsteady on his feet. Gets feet crossed easily.    Balance Overall balance assessment: Needs assistance Sitting-balance support: Feet supported Sitting balance-Leahy Scale: Good     Standing balance support: Single extremity supported Standing balance-Leahy Scale: Poor Standing balance comment: Pt could stand without physical assist for short moments but easily lost balance once moving.                           ADL either performed or assessed with clinical judgement   ADL Overall ADL's : Needs assistance/impaired Eating/Feeding: Independent;Sitting   Grooming: Wash/dry hands;Wash/dry face;Set up;Sitting   Upper Body Bathing: Set up;Sitting   Lower Body Bathing: Minimal assistance;Sit to/from stand;Cueing for safety Lower Body Bathing Details (indicate cue type and reason): Pt can get to feet but does need assist when on his feet. Upper Body Dressing : Set up;Sitting   Lower Body Dressing: Minimal assistance;Sit to/from stand Lower Body Dressing Details (indicate cue type and reason): assist when standing to pull pants up and fasten.  Toilet Transfer: Minimal assistance;Ambulation;Grab bars;Comfort height toilet Toilet Transfer Details (indicate cue type and reason): Pt walked to bathroom to clean self with LOB at times with hand held assist.  Pt required min guard to stand from commode. Toileting- Clothing Manipulation and Hygiene: Minimal assistance;Sit to/from stand Toileting - Clothing Manipulation Details (indicate cue type and reason): min assist only when standing     Functional mobility during ADLs: Minimal assistance General ADL Comments: Pt does well with adls in sitting but does require min assist when on his feet due to poor balance.       Vision Baseline Vision/History: Wears glasses Patient Visual Report: No change from baseline Vision Assessment?: No apparent visual deficits     Perception Perception Perception Tested?: No   Praxis Praxis Praxis tested?: Within functional limits    Pertinent Vitals/Pain Pain Assessment: No/denies pain  Hand Dominance Right   Extremity/Trunk Assessment Upper Extremity Assessment Upper Extremity Assessment: Overall WFL for tasks assessed   Lower Extremity Assessment Lower Extremity Assessment: Defer to PT evaluation   Cervical / Trunk Assessment Cervical / Trunk Assessment: Normal   Communication  Communication Communication: No difficulties   Cognition Arousal/Alertness: Awake/alert Behavior During Therapy: WFL for tasks assessed/performed Overall Cognitive Status: Within Functional Limits for tasks assessed                                 General Comments: Pt with states dentures need to be refitted.  Dentures loose and pt drooled several times during session.   General Comments  Pt lives alone.  Unsteady on his feet at this time. Will need 24/7 assist at current level.    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        OT Problem List: Impaired balance (sitting and/or standing);Decreased safety awareness      OT Treatment/Interventions: Self-care/ADL training;Therapeutic activities;Balance training    OT Goals(Current goals can be found in the care plan section) Acute Rehab OT Goals Patient Stated Goal: to get stronger OT Goal Formulation: With patient Time For Goal Achievement: 11/22/17 Potential to Achieve Goals: Good ADL Goals Pt Will Perform Lower Body Bathing: with supervision;sit to/from stand Pt Will Perform Lower Body Dressing: with modified independence;sit to/from stand Pt Will Perform Tub/Shower Transfer: Tub transfer;with supervision;ambulating Additional ADL Goal #1: Pt will walk to bathroom with appropriate assistive device and toilet with mod I.  OT Frequency: Min 2X/week   Barriers to D/C: Decreased caregiver support  Pt may or may not have 24/7 S available.  Large family present but nobody could say  if this support was available.       Co-evaluation PT/OT/SLP Co-Evaluation/Treatment: Yes Reason for Co-Treatment: To address functional/ADL transfers PT goals addressed during session: Balance;Mobility/safety with mobility OT goals addressed during session: ADL's and self-care      AM-PAC PT "6 Clicks" Daily Activity     Outcome Measure Help from another person eating meals?: None Help from another person taking care of personal grooming?: None Help from another person toileting, which includes using toliet, bedpan, or urinal?: A Little Help from another person bathing (including washing, rinsing, drying)?: A Little Help from another person to put on and taking off regular upper body clothing?: None Help from another person to put on and taking off regular lower body clothing?: A Little 6 Click Score: 21   End of Session Equipment Utilized During Treatment: Oxygen Nurse Communication: Mobility status  Activity Tolerance: Patient tolerated treatment well Patient left: in chair;with call bell/phone within reach;with chair alarm set  OT Visit Diagnosis: Unsteadiness on feet (R26.81)                Time: 2130-8657 OT Time Calculation (min): 28 min Charges:  OT General Charges $OT Visit: 1 Visit OT Evaluation $OT Eval Moderate Complexity: 1  Mod G-Codes:     Tory Emerald, OTR/L [phone]  Hope Budds 11/08/2017, 12:44 PM

## 2017-11-08 NOTE — Progress Notes (Signed)
ANTICOAGULATION CONSULT NOTE - Follow Up Consult  Pharmacy Consult for Heparin Indication: atrial fibrillation  No Known Allergies  Patient Measurements: Height: 5\' 6"  (167.6 cm) Weight: 140 lb 6.9 oz (63.7 kg)(A) IBW/kg (Calculated) : 63.8  Vital Signs: Temp: 97.5 F (36.4 C) (01/15 0742) Temp Source: Oral (01/15 0742) BP: 95/78 (01/15 0742) Pulse Rate: 126 (01/15 0742)  Labs: Recent Labs    11/06/17 1542  11/06/17 1935 11/07/17 0152 11/07/17 0731 11/07/17 1843 11/07/17 2229 11/08/17 0253  HGB 15.6  --   --   --   --   --   --  13.6  HCT 47.1  --   --   --   --   --   --  42.2  PLT 136*  --   --   --   --   --   --  130*  LABPROT  --   --  15.2  --   --   --   --   --   INR  --   --  1.21  --   --   --   --   --   HEPARINUNFRC  --   --   --   --   --   --  0.47 0.55  CREATININE  --    < >  --   --  1.58* 1.79*  --  1.70*  TROPONINI  --   --  0.29* 0.44* 0.37*  --   --   --    < > = values in this interval not displayed.    Estimated Creatinine Clearance: 30.2 mL/min (A) (by C-G formula based on SCr of 1.7 mg/dL (H)).   Medications:  Heparin @ 950 units/hr  Assessment: [name] [name] continues on heparin for new afib. Heparin level is therapeutic at 0.55. CBC stable. No bleeding.   Noted frequent falls at home per MD note. CT and MRI brain negative for acute findings.   Goal of Therapy:  Heparin level 0.3-0.7 units/ml Monitor platelets by anticoagulation protocol: Yes   Plan:  1) Continue heparin at 950 units/hr 2) Daily heparin level and CBC 3) Follow up oral anticoagulation plan  Fredrik RiggerMarkle, Missie Gehrig Sue 11/08/2017,9:07 AM

## 2017-11-08 NOTE — Progress Notes (Signed)
PROGRESS NOTE  [name] LernerBobby Darr WGN:[id number] DOB: 10/09/1933 DOA: 11/06/2017 PCP: Burtis JunesBlount, Alvin Vincent, MD (Inactive)  HPI/Recap of past 1324 hours: 84 year old male with hypertension, hypothyroidism, CAD with history of MI s/p PCI with stent, CKD3, tobacco abuse presented with dyspnea of 2 months duration, cough, confusion, and a fall. Seen by his PCP last week and diagnosed with new onset CHF. Started on Lasix and referred to cardiologist. At presentation, hypoxic with O2 sat of 84% RA, pulmonary vascular congestion on CXR. Admitted for acute on chronic HFREF.  11/07/17: 2D echo revealed LVEF 15-20% complicated by Aflutter. Cardiology following and plan for left heart cath on Thursday 11/10/17.  Patient seen and examined with his family members at bedside. He denies chest pain, palpitations, or dyspnea. Still requiring 4L O2 supplementation to maintain O2 saturation above 90%.  Assessment/Plan: Principal Problem:   Acute diastolic (congestive) heart failure (HCC) Active Problems:   Hypothyroidism   TOBACCO ABUSE   HYPERTENSION, BENIGN ESSENTIAL   CAD (coronary artery disease)   Acute metabolic encephalopathy   Elevated troponin   Fall   Lactic acidosis   Diarrhea   Acute renal failure superimposed on stage 2 chronic kidney disease (HCC)   Abnormal LFTs   Acute on chronic respiratory failure with hypoxia (HCC)   CHF exacerbation (HCC)  Acute hypoxic respiratory failure 2/2 to acute on chronic HFREF 15-20% -Cardiology following -continue diuresis -continue daily weight, strict I&Os -Fluid restriction -O2 supplementation to maintain O2 saturation 92% or above  Acute on chronic HFREF 15-20% -management as stated above  A-flutter -rate controlled -amiodarone -avoid BB and CCB per cardiology -close monitoring on telemetry -heparin drip as CVA ppx -cardiology following  AKI on CKD 3, improving -cr 1.70 from 1.79 -avoid nephrotoxic meds/hypotension -BMP am  Suspected  dysphagia -swallow evaluation by speech therapist  -monitor closely for signs of aspiration -aspiration precaution  Elevated troponin level -No chest pain -troponin peaked at 0.44 trending down -monitor symptoms -possible left heart cath thurs 11/10/17  CAD s/p PCI with stent -ASA, lipitor -left heart cath planned 11/10/17  Acute metabolic encephalopathy, resolved -possibly 2/2 to hypoglycemia -monitor  Hypoglycemia -A1c of 6.3 -Monitor CBG every 2 hours -D10 50cc/hr-caution with IV fluid in the setting of severe heart failure  Transaminitis -hep a and B negative -positive HCV antibody result -recommended to obtain HCV nucleic acid amplification   Hypothyroidism -Not on medications. -Normal TSH with mildly elevated free T4.   TOBACCO ABUSE -Nicotine patch -tobacco cessation counseling at bedside   HYPERTENSION -BP is soft -Cardiology recommends to avoid BB and CCB at this time -monitor  Prolonged QT interval  -magnesium normal -Avoid QT prolonging agents.  Ambulatory dysfunction/Frequent falls at home -CT head and MRI brain negative for acute findings.  -B12, TSH normal.  -Right knee wound from recent fall.  -Wound care consult appreciated.  -Pt evaluate and treat -PT recommends SNF  Code Status: full   Family Communication: with sister. All questions answered to her satisfaction.   Disposition Plan: Possible left heart cath on Thurs 11/10/17   Consultants:  Cardiology  Procedures:  None   Antimicrobials:  None  DVT prophylaxis:  heparin drip due to a-flutter   Objective: Vitals:   11/08/17 0251 11/08/17 0324 11/08/17 0444 11/08/17 0742  BP:  115/72  95/78  Pulse:    (!) 126  Resp: (!) 30 (!) 30  (!) 31  Temp:    (!) 97.5 F (36.4 C)  TempSrc:    Oral  SpO2:  95%  97%  Weight:   63.7 kg (140 lb 6.9 oz)   Height:        Intake/Output Summary (Last 24 hours) at 11/08/2017 0950 Last data filed at 11/08/2017 0800 Gross per  24 hour  Intake 1264.22 ml  Output 2475 ml  Net -1210.78 ml   Filed Weights   11/06/17 2100 11/08/17 0444  Weight: 66.5 kg (146 lb 9.7 oz) 63.7 kg (140 lb 6.9 oz)    Exam:   General:  84 yo AAM WD WN NAD A&O x 3  Cardiovascular: RRR no rubs or gallops JVD present  Respiratory: Diffused crackles bilaterally no wheeezes  Abdomen: soft NT ND NBS x4  Musculoskeletal: No LE edema non focal  Skin: no open lesions noted  Psychiatry: mood appropriate for condition and setting    Data Reviewed: CBC: Recent Labs  Lab 11/06/17 1542 11/08/17 0253  WBC 11.1* 8.0  HGB 15.6 13.6  HCT 47.1 42.2  MCV 96.5 98.4  PLT 136* 130*   Basic Metabolic Panel: Recent Labs  Lab 11/06/17 1651 11/07/17 0731 11/07/17 0843 11/07/17 1843 11/08/17 0253  NA 144 145  --  143 141  K 4.8 4.2  --  4.6 4.1  CL 108 108  --  105 101  CO2 25 25  --  24 29  GLUCOSE 123* 121*  --  99 74  BUN 42* 39*  --  41* 39*  CREATININE 1.76* 1.58*  --  1.79* 1.70*  CALCIUM 9.0 8.5*  --  8.3* 8.2*  MG  --   --  2.0  --   --    GFR: Estimated Creatinine Clearance: 30.2 mL/min (A) (by C-G formula based on SCr of 1.7 mg/dL (H)). Liver Function Tests: Recent Labs  Lab 11/06/17 1651  AST 78*  ALT 59  ALKPHOS 110  BILITOT 1.1  PROT 6.0*  ALBUMIN 3.6   No results for input(s): LIPASE, AMYLASE in the last 168 hours. Recent Labs  Lab 11/06/17 1651  AMMONIA 22   Coagulation Profile: Recent Labs  Lab 11/06/17 1935  INR 1.21   Cardiac Enzymes: Recent Labs  Lab 11/06/17 1935 11/07/17 0152 11/07/17 0731  TROPONINI 0.29* 0.44* 0.37*   BNP (last 3 results) No results for input(s): PROBNP in the last 8760 hours. HbA1C: Recent Labs    11/07/17 0152  HGBA1C 6.3*   CBG: Recent Labs  Lab 11/07/17 1718 11/07/17 1746 11/07/17 1950 11/07/17 2323 11/08/17 0421  GLUCAP 51* 48* 85 93 95   Lipid Profile: Recent Labs    11/07/17 0201  CHOL 149  HDL 42  LDLCALC 94  TRIG 64  CHOLHDL 3.5    Thyroid Function Tests: Recent Labs    11/07/17 0152  TSH 1.340  FREET4 1.54*   Anemia Panel: Recent Labs    11/06/17 1935  VITAMINB12 931*   Urine analysis:    Component Value Date/Time   COLORURINE STRAW (A) 11/06/2017 1804   APPEARANCEUR CLEAR 11/06/2017 1804   LABSPEC 1.009 11/06/2017 1804   PHURINE 5.0 11/06/2017 1804   GLUCOSEU NEGATIVE 11/06/2017 1804   HGBUR NEGATIVE 11/06/2017 1804   BILIRUBINUR NEGATIVE 11/06/2017 1804   KETONESUR NEGATIVE 11/06/2017 1804   PROTEINUR NEGATIVE 11/06/2017 1804   NITRITE NEGATIVE 11/06/2017 1804   LEUKOCYTESUR NEGATIVE 11/06/2017 1804   Sepsis Labs: @LABRCNTIP (procalcitonin:4,lacticidven:4)  ) Recent Results (from the past 240 hour(s))  Culture, blood (Routine X 2) w Reflex to ID Panel     Status: None (Preliminary result)  Collection Time: 11/06/17  7:40 PM  Result Value Ref Range Status   Specimen Description BLOOD RIGHT ANTECUBITAL  Final   Special Requests   Final    BOTTLES DRAWN AEROBIC AND ANAEROBIC Blood Culture adequate volume   Culture NO GROWTH < 24 HOURS  Final   Report Status PENDING  Incomplete  Culture, blood (Routine X 2) w Reflex to ID Panel     Status: None (Preliminary result)   Collection Time: 11/06/17  7:57 PM  Result Value Ref Range Status   Specimen Description BLOOD RIGHT HAND  Final   Special Requests IN PEDIATRIC BOTTLE Blood Culture adequate volume  Final   Culture NO GROWTH < 24 HOURS  Final   Report Status PENDING  Incomplete  Urine Culture     Status: None   Collection Time: 11/07/17 12:55 AM  Result Value Ref Range Status   Specimen Description URINE, RANDOM  Final   Special Requests NONE  Final   Culture NO GROWTH  Final   Report Status 11/08/2017 FINAL  Final  MRSA PCR Screening     Status: None   Collection Time: 11/07/17  9:57 PM  Result Value Ref Range Status   MRSA by PCR NEGATIVE NEGATIVE Final    Comment:        The GeneXpert MRSA Assay (FDA approved for NASAL  specimens only), is one component of a comprehensive MRSA colonization surveillance program. It is not intended to diagnose MRSA infection nor to guide or monitor treatment for MRSA infections.       Studies: Dg Chest Port 1 View  Result Date: 11/07/2017 CLINICAL DATA:  Shortness of breath, history CHF, hypertension, MI, chronic kidney disease EXAM: PORTABLE CHEST 1 VIEW COMPARISON:  Portable exam 1149 hours compared to 11/06/2017 FINDINGS: Enlargement of cardiac silhouette. Atherosclerotic calcification aorta. Slight pulmonary vascular congestion. Scarring in RIGHT upper lobe with mild bibasilar atelectasis. No definite acute infiltrate, pleural effusion or pneumothorax. Bones demineralized. IMPRESSION: Enlargement of cardiac silhouette with pulmonary vascular congestion. Mild bibasilar atelectasis. Electronically Signed   By: Ulyses Southward M.D.   On: 11/07/2017 12:20    Scheduled Meds: . aspirin  324 mg Oral Daily  . atorvastatin  40 mg Oral q1800  . furosemide  40 mg Intravenous Q8H  . metoprolol tartrate  25 mg Oral BID  . mupirocin cream   Topical Daily  . nicotine  21 mg Transdermal Daily  . sodium chloride flush  3 mL Intravenous Q12H    Continuous Infusions: . sodium chloride    . dextrose 50 mL/hr at 11/08/17 0400  . heparin 950 Units/hr (11/08/17 0400)     LOS: 2 days     Darlin Drop, MD [facility] Pager [phone]  If 7PM-7AM, please contact night-coverage [email or web address] Password [email or web address] 11/08/2017, 9:50 AM

## 2017-11-08 NOTE — Evaluation (Signed)
Physical Therapy Evaluation Patient Details Name: [name] [name] MRN: [id number] DOB: 1933/03/06 Today's Date: 11/08/2017   History of Present Illness  Pt is a 84 yo male admitted iwth SOB, confusion and poor balance. Pt found to have a new CHF exacerbation.  Pt iwth h/o CAD, HTN, MI, CKD and SOB.    Clinical Impression  Pt presents to PT with unsteady gait making him a high fall risk if he returns home alone.Currently recommend ST-SNF to address balance and weakness issues. If pt makes rapid progress or family can provide initial 24 hour assist at home then he may be able to go home with HHPT.    Follow Up Recommendations SNF    Equipment Recommendations  Rolling walker with 5" wheels    Recommendations for Other Services       Precautions / Restrictions Precautions Precautions: Fall Precaution Comments: Pt reports one fall before admission. Restrictions Weight Bearing Restrictions: No      Mobility  Bed Mobility Overal bed mobility: Needs Assistance Bed Mobility: Supine to Sit     Supine to sit: Min guard     General bed mobility comments: pt used bedrail and incr time  Transfers Overall transfer level: Needs assistance Equipment used: 1 person hand held assist Transfers: Sit to/from [employer] Sit to Stand: Min assist Stand pivot transfers: Min assist       General transfer comment: Assist for balance  Ambulation/Gait Ambulation/Gait assistance: Min assist Ambulation Distance (Feet): 150 Feet Assistive device: 1 person hand held assist Gait Pattern/deviations: Step-through pattern;Decreased stride length;Scissoring Gait velocity: decr Gait velocity interpretation: Below normal speed for age/gender General Gait Details: Assist for balance due to unsteady gait and scissoring of feet. Unable to obtain any SpO2 reading. Pt on 6L of O2 at rest so left O2 at 6L for amb but unable to titrate due to no SpO2 reading.  Stairs             Wheelchair Mobility    Modified Rankin (Stroke Patients Only)       Balance Overall balance assessment: Needs assistance Sitting-balance support: Feet supported Sitting balance-Leahy Scale: Good     Standing balance support: Single extremity supported Standing balance-Leahy Scale: Poor Standing balance comment: min guard for static standing and min assist for any dynamic                             Pertinent Vitals/Pain Pain Assessment: No/denies pain    Home Living Family/patient expects to be discharged to:: Private residence Living Arrangements: Alone Available Help at Discharge: Family;Available PRN/intermittently Type of Home: House Home Access: Stairs to enter Entrance Stairs-Rails: Right Entrance Stairs-Number of Steps: 4 Home Layout: One level Home Equipment: None      Prior Function Level of Independence: Independent         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  Dentures loose and pt drooled several times during session.      General Comments General comments (skin integrity, edema, etc.): Pt lives alone.  Unsteady on his feet at this time. Will need 24/7 assist at current level.    Exercises     Assessment/Plan    PT Assessment Patient needs continued PT services  PT Problem List  Decreased strength;Decreased activity tolerance;Decreased balance;Decreased mobility       PT Treatment Interventions DME instruction;Gait training;Stair training;Functional mobility training;Therapeutic activities;Therapeutic exercise;Balance training;Patient/family education    PT Goals (Current goals can be found in the Care Plan section)  Acute Rehab PT Goals Patient Stated Goal: to get stronger PT Goal Formulation: With patient Time For Goal Achievement: 11/22/17 Potential to Achieve Goals: Good    Frequency Min 3X/week   Barriers to discharge Inaccessible home environment;Decreased caregiver support Lives alone with stairs to enter    Co-evaluation PT/OT/SLP Co-Evaluation/Treatment: Yes Reason for Co-Treatment: To address functional/ADL transfers PT goals addressed during session: Mobility/safety with mobility;Balance OT goals addressed during session: ADL's and self-care       AM-PAC PT "6 Clicks" Daily Activity  Outcome Measure Difficulty turning over in bed (including adjusting bedclothes, sheets and blankets)?: None Difficulty moving from lying on back to sitting on the side of the bed? : A Little Difficulty sitting down on and standing up from a chair with arms (e.g., wheelchair, bedside commode, etc,.)?: Unable Help needed moving to and from a bed to chair (including a wheelchair)?: A Little Help needed walking in hospital room?: A Little Help needed climbing 3-5 steps with a railing? : A Little 6 Click Score: 17    End of Session Equipment Utilized During Treatment: Gait belt;Oxygen Activity Tolerance: Patient limited by fatigue Patient left: in chair;with call bell/phone within reach;with chair alarm set;with family/visitor present Nurse Communication: Mobility status PT Visit Diagnosis: Unsteadiness on feet (R26.81);Muscle weakness (generalized) (M62.81)    Time: [id number] PT Time Calculation (min) (ACUTE ONLY): 25 min   Charges:   PT Evaluation $PT  Eval Moderate Complexity: 1 Mod     PT G CodesMarland Kitchen:        [facility] Creek[facility]ary Kasumi Ditullio PT [phone]   Angelina OkCary W Foothills HospitalMaycok 11/08/2017, 2:03 PM

## 2017-11-08 NOTE — Progress Notes (Signed)
Unable to obtain pulse oxygen. Have tried to switch and change probes to head, eat, finger and tried changing out the connecting cord. Per MD ordered ABG. When pulse ox picks up sats 100%.

## 2017-11-08 NOTE — Consult Note (Signed)
            [facility]  11/08/2017  [name] LernerBobby [name] 09/26/1933 [id number]   Went to see patient at the bedside to identify possible discharge needs but patient had been transferred to another unit per staff report.  Patient was moved to Stepdown- 2C 07 due to heart rhythm fluctuations (patient in and out of Rapid narrow complex tachycardia per RN note). He had increased heart rate at 130's-150's.  Will attempt to see patient at another time when out of Stepdown unit (SDU).   For additional questions please contact:  Karin GoldenLorraine A. Betsi Crespi, BSN, RN-BC [facility] PRIMARY [facility] Navigator Cell: [phone]

## 2017-11-08 NOTE — Progress Notes (Signed)
Subjective:  Feels better Breathing improved  Diuresing well Paroxysmal Afib/flutter  Objective:  Vital Signs in the last 24 hours: Temp:  [97.4 F (36.3 C)-98.3 F (36.8 C)] 97.5 F (36.4 C) (01/15 0742) Pulse Rate:  [67-130] 126 (01/15 0742) Resp:  [15-37] 31 (01/15 0742) BP: (83-156)/(57-134) 95/78 (01/15 0742) SpO2:  [49 %-99 %] 97 % (01/15 0742) Weight:  [63.7 kg (140 lb 6.9 oz)] 63.7 kg (140 lb 6.9 oz) (01/15 0444)  Intake/Output from previous day: 01/14 0701 - 01/15 0700 In: 1624.2 [P.O.:840; I.V.:754.2] Out: 2525 [Urine:2525] Intake/Output from this shift: Total I/O In: -  Out: 475 [Urine:475]   Net -1.3 L  Physical Exam: Nursing note and vitals reviewed. Constitutional: He is oriented to person, place, and time. He appears well-developed and well-nourished. No distress.  HENT:  Head: Normocephalic and atraumatic.  Eyes: Pupils are equal, round, and reactive to light.  Neck: Normal range of motion. Neck supple. JVD present.  Cardiovascular: Normal rate and regular rhythm. Exam reveals no gallop.  II/VI holosystolic murmur RLSB Absent distal pulses. Chronic ischemic changes. No ulcers, no critical limb ischemia Respiratory: Effort normal. He has no wheezes. He has right basal ales. He exhibits no tenderness.  GI: Soft. Bowel sounds are normal. He exhibits distension. There is no tenderness.  Musculoskeletal: Normal range of motion. He exhibits edema (Tace bilateral).  Neurological: He is alert and oriented to person, place, and time.  Skin: Skin is warm and dry.  Superficial abrasions bilateral shins   Psychiatric: He has a normal mood and affect.     Lab Results: Recent Labs    11/06/17 1542 11/08/17 0253  WBC 11.1* 8.0  HGB 15.6 13.6  PLT 136* 130*   Recent Labs    11/07/17 1843 11/08/17 0253  NA 143 141  K 4.6 4.1  CL 105 101  CO2 24 29  GLUCOSE 99 74  BUN 41* 39*  CREATININE 1.79* 1.70*   Recent Labs    11/07/17 0152 11/07/17 0731   TROPONINI 0.44* 0.37*   Hepatic Function Panel Recent Labs    11/06/17 1651  PROT 6.0*  ALBUMIN 3.6  AST 78*  ALT 59  ALKPHOS 110  BILITOT 1.1   Recent Labs    11/07/17 0201  CHOL 149   No results for input(s): PROTIME in the last 72 hours.  Imaging: CXR 11/07/2017: CLINICAL DATA:  Shortness of breath, history CHF, hypertension, MI, chronic kidney disease  EXAM: PORTABLE CHEST 1 VIEW  COMPARISON:  Portable exam 1149 hours compared to 11/06/2017  FINDINGS: Enlargement of cardiac silhouette.  Atherosclerotic calcification aorta.  Slight pulmonary vascular congestion.  Scarring in RIGHT upper lobe with mild bibasilar atelectasis.  No definite acute infiltrate, pleural effusion or pneumothorax.  Bones demineralized.  IMPRESSION: Enlargement of cardiac silhouette with pulmonary vascular congestion.  Mild bibasilar atelectasis.   Cardiac Studies: Study Conclusions  - Left ventricle: The cavity size was mildly dilated. The estimated   ejection fraction was in the range of 15% to 20%. Severe diffuse   global hypokinesis. Grade II diastolic dysfunction. Elevated LAP. - Aortic valve: There was moderate regurgitation. - Mitral valve: Mildly dilated annulus. There was mild to moderate   regurgitation. - Left atrium: The atrium was moderately dilated. - Right ventricle: The cavity size was mildly dilated. Systolic   function was moderately reduced. - Right atrium: The atrium was mildly dilated. - Tricuspid valve: Mildly dilated annulus. There was   moderate-severe regurgitation. - Pulmonary arteries: PA peak pressure:  46 mm Hg (S). May be   underestimated due to elevated RA pressure - Pericardium, extracardiac: There was a left pleural effusion.  Impressions:  - Severe biventricular failure.  Assessment:  84 year old African-American male   Acute on chronic systolic and diastolic heart failure: Biventricular failure EF 15-20% Etiology  could be long standing CAD, or nonischemic cardiomyopathy Paroxismal atrial flutter CHA2DS2VASc score 4  4.8% annual stroke risk Troponin elevation: Likely type II MI in the setting of heart failure Acute kidney injury: Likely cardiorenal syndrome Tobacco abuse Possible PAD  Recommendations: Switch to lasix 40 mg IV bid. Add PO lasix 40 PO bid. Wean to PO diuresis tomorrow.  Echocardiogram reviewed Continue heparin for anticoagulation the setting of paroxysmal atrial flutter.  Currently in sinus rhythm.  If recurrence of atrial flutter, given borderline low systolic blood pressure, would recommend amiodarone for rate and rhythm control. Avoid IV diltiazem or beta blockers.  Continue aspirin, statin.  Start low dose bidil 20-37.5 1/2 tab tid for afterload reduction. Unable to use ACEiARB/ARNI at this time given acute kidney injury.  Continue metoprolol tartarate 25 mg bid for now. Will convert to succinate prior to discharge.  Plan for right and left heart cath after adequate diuresis, likely on Thursday 11/10/17.  Heart failure diet. Maintain K around 4, Mg around 2.    LOS: 2 days    [name] [name] [name] 11/08/2017, 10:01 AM  Jeniece Hannis Emiliano Dyer, MD [facility] [place] Pager: [phone] Office: [phone] If no answer Cell [phone]

## 2017-11-09 DIAGNOSIS — I5043 Acute on chronic combined systolic (congestive) and diastolic (congestive) heart failure: Secondary | ICD-10-CM

## 2017-11-09 LAB — CREATININE, SERUM
Creatinine, Ser: 1.62 mg/dL — ABNORMAL HIGH (ref 0.61–1.24)
GFR calc Af Amer: 44 mL/min — ABNORMAL LOW (ref >60)
GFR, EST NON AFRICAN AMERICAN: 38 mL/min — AB (ref >60)

## 2017-11-09 LAB — MAGNESIUM: Magnesium: 1.6 mg/dL — ABNORMAL LOW (ref 1.7–2.4)

## 2017-11-09 LAB — COMPREHENSIVE METABOLIC PANEL
ALT: 41 U/L (ref 17–63)
AST: 53 U/L — AB (ref 15–41)
Albumin: 2.8 g/dL — ABNORMAL LOW (ref 3.5–5.0)
Alkaline Phosphatase: 101 U/L (ref 38–126)
Anion gap: 11 (ref 5–15)
BUN: 37 mg/dL — AB (ref 6–20)
CO2: 32 mmol/L (ref 22–32)
CREATININE: 1.81 mg/dL — AB (ref 0.61–1.24)
Calcium: 7.9 mg/dL — ABNORMAL LOW (ref 8.9–10.3)
Chloride: 97 mmol/L — ABNORMAL LOW (ref 101–111)
GFR, EST AFRICAN AMERICAN: 38 mL/min — AB (ref >60)
GFR, EST NON AFRICAN AMERICAN: 33 mL/min — AB (ref >60)
Glucose, Bld: 116 mg/dL — ABNORMAL HIGH (ref 65–99)
POTASSIUM: 3.8 mmol/L (ref 3.5–5.1)
Sodium: 140 mmol/L (ref 135–145)
Total Bilirubin: 0.7 mg/dL (ref 0.3–1.2)
Total Protein: 4.8 g/dL — ABNORMAL LOW (ref 6.5–8.1)

## 2017-11-09 LAB — CBC
HEMATOCRIT: 39.1 % (ref 39.0–52.0)
Hemoglobin: 12.7 g/dL — ABNORMAL LOW (ref 13.0–17.0)
MCH: 32.2 pg (ref 26.0–34.0)
MCHC: 32.5 g/dL (ref 30.0–36.0)
MCV: 99 fL (ref 78.0–100.0)
Platelets: 130 10*3/uL — ABNORMAL LOW (ref 150–400)
RBC: 3.95 MIL/uL — ABNORMAL LOW (ref 4.22–5.81)
RDW: 14.8 % (ref 11.5–15.5)
WBC: 8.8 10*3/uL (ref 4.0–10.5)

## 2017-11-09 LAB — GLUCOSE, CAPILLARY
GLUCOSE-CAPILLARY: 103 mg/dL — AB (ref 65–99)
GLUCOSE-CAPILLARY: 105 mg/dL — AB (ref 65–99)
Glucose-Capillary: 91 mg/dL (ref 65–99)
Glucose-Capillary: 125 mg/dL — ABNORMAL HIGH (ref 65–99)
Glucose-Capillary: 112 mg/dL — ABNORMAL HIGH (ref 65–99)
Glucose-Capillary: 110 mg/dL — ABNORMAL HIGH (ref 65–99)

## 2017-11-09 LAB — HEPARIN LEVEL (UNFRACTIONATED): Heparin Unfractionated: 0.49 IU/mL (ref 0.30–0.70)

## 2017-11-09 MED ORDER — METOPROLOL SUCCINATE ER 50 MG PO TB24: 50.0000 mg | ORAL_TABLET | Freq: Every day | ORAL | Status: DC

## 2017-11-09 MED ORDER — MAGNESIUM SULFATE 2 GM/50ML IV SOLN
2.0000 g | Freq: Once | INTRAVENOUS | Status: AC
  Administered 2017-11-09: 2 g via INTRAVENOUS
  Filled 2017-11-09: qty 50

## 2017-11-09 MED ORDER — SODIUM CHLORIDE 0.9 % IV SOLN
INTRAVENOUS | Status: AC
  Administered 2017-11-09: 1 mL via INTRAVENOUS

## 2017-11-09 MED ORDER — METOPROLOL SUCCINATE ER 25 MG PO TB24
25.0000 mg | ORAL_TABLET | Freq: Every day | ORAL | Status: DC
  Filled 2017-11-09: qty 1

## 2017-11-09 MED ORDER — DEXTROSE 10 % IV SOLN
INTRAVENOUS | Status: DC
  Administered 2017-11-09: 1 mL via INTRAVENOUS

## 2017-11-09 NOTE — Progress Notes (Signed)
ANTICOAGULATION CONSULT NOTE - Follow Up Consult  Pharmacy Consult for Heparin Indication: atrial fibrillation  No Known Allergies  Patient Measurements: Height: 5\' 6"  (167.6 cm) Weight: 139 lb 15.9 oz (63.5 kg) IBW/kg (Calculated) : 63.8  Vital Signs: Temp: 97.4 F (36.3 C) (01/16 0832) Temp Source: Oral (01/16 0832) BP: 92/66 (01/16 0832) Pulse Rate: 74 (01/16 0856)  Labs: Recent Labs    11/06/17 1542  11/06/17 1935 11/07/17 0152 11/07/17 0731 11/07/17 1843 11/07/17 2229 11/08/17 0253 11/09/17 0257  HGB 15.6  --   --   --   --   --   --  13.6 12.7*  HCT 47.1  --   --   --   --   --   --  42.2 39.1  PLT 136*  --   --   --   --   --   --  130* 130*  LABPROT  --   --  15.2  --   --   --   --   --   --   INR  --   --  1.21  --   --   --   --   --   --   HEPARINUNFRC  --   --   --   --   --   --  0.47 0.55 0.49  CREATININE  --    < >  --   --  1.58* 1.79*  --  1.70* 1.81*  TROPONINI  --   --  0.29* 0.44* 0.37*  --   --   --   --    < > = values in this interval not displayed.    Estimated Creatinine Clearance: 28.3 mL/min (A) (by C-G formula based on SCr of 1.81 mg/dL (H)).   Medications:  Heparin @ 950 units/hr  Assessment: 84yom continues on heparin for new afib. Heparin level is therapeutic at 0.47. CBC stable. No bleeding. Plan for cath today if Cr improves with hydration - checking at noon.  Noted frequent falls at home per MD note. CT and MRI brain negative for acute findings.   Goal of Therapy:  Heparin level 0.3-0.7 units/ml Monitor platelets by anticoagulation protocol: Yes   Plan:  1) Continue heparin at 950 units/hr 2) Daily heparin level and CBC 3) Follow up plan for cath 4) Follow up oral anticoagulation plan  Fredrik RiggerMarkle, Leyton Brownlee Sue 11/09/2017,9:07 AM

## 2017-11-09 NOTE — Progress Notes (Addendum)
PROGRESS NOTE    [name] [name]  ZOX:[id number] DOB: 22-Feb-1933 DOA: 11/06/2017 PCP: Burtis Junes, MD (Inactive)   Brief Narrative:  84 year old male with hypertension, hypothyroidism, CAD with history of MI s/p PCI with stent, CKD3, tobacco abuse presented with dyspnea of 2 months duration associated with cough, confusion, and a fall. Seen by his PCP a week  prior to that admission and diagnosed with new onset CHF. Started on Lasix and referred to cardiologist. At presentation to the ER,pt was hypoxic with O2 sat of 84% RA, pulmonary vascular congestion on CXR. Admitted for acute on chronic CHF. 11/07/17: 2D echo revealed LVEF 15-20% complicated by Aflutter. Cardiology following and plan for left heart cath on Thursday 11/10/17.  Assessment & Plan:   #Acute respiratory failure with hypoxia in the setting of CHF -Lasix on hold today because of elevated serum creatinine level, plan for catheter today.  Gentle IV fluids started by cardiologist.  #Acute on chronic combined systolic and diastolic congestive heart failure, EF of 15-20% -Catheter tomorrow.  Continue BiDil -Unable to use ACE or R because of renal failure -Switch to metoprolol to succinate monitor blood pressure. -Plan for oral Lasix after cath.  Started gentle hydration by cardiologist.  Holding Lasix IV today. -Currently on IV heparin.  #Paroxysmal atrial flutter: On IV heparin.  On metoprolol.  Monitor heart rate.  #Elevated troponin level in the setting of CHF: Plan for cardiac cath .  #Chronic kidney disease stage III: Monitor BMP.  Mild elevation in creatinine likely hemodynamically mediated in the setting of hypotension and diuretics.  On gentle IV hydration before cardiac cath.  Patient understand the risk of worsening renal failure with contrast during cardiac cath.  Monitor BMP.  #Suspected dysphagia: Evaluate by speech and swallow.  Aspiration precaution  #Acute metabolic encephalopathy due to hypoglycemia:  Resolved now.  #Hypoglycemia: Blood sugar level improving.  Encourage oral intake.  Currently n.p.o. for possible cath.  Changed dextrose to D10.  Patient was on D5W.  #Transaminitis: Hepatitis C virus antibody positive.  Recommend outpatient follow-up.  #Tobacco abuse: Nicotine patch patient was counseled.  #History of hypertension: Blood pressure was low this morning.  Currently on cardiac medication.  Continue to monitor blood pressure closely.  #Frequent falls, physical deconditioning: CT MRI no acute finding.  PT/OT recommended a skilled nursing home.  Social worker consult.  #RPR and T. pallidum antibody positive: Contacting ID further recommendation.  Addendum: Discussed with Dr. Luciana Axe from infectious disease who recommended lumbar puncture for VDRL before deciding treatment.  Patient likely needs treatment.  Dr. Luciana Axe will follow-up after the test.  DVT prophylaxis: IV heparin Code Status: Full code Family Communication: No family at bedside Disposition Plan: Stepdown    Consultants:   Cardiology  Procedures: None Antimicrobials: None  Subjective: Seen and examined at bedside.  Denies headache, dizziness, nausea vomiting chest pain shortness of breath.  Objective: Vitals:   11/09/17 0751 11/09/17 0832 11/09/17 0856 11/09/17 1119  BP: (!) 93/57 92/66  110/68  Pulse: 70 (!) 35 74 93  Resp: (!) 31 (!) 21  15  Temp:  (!) 97.4 F (36.3 C)  (!) 97.5 F (36.4 C)  TempSrc:  Oral  Oral  SpO2: 97% 98%    Weight:      Height:        Intake/Output Summary (Last 24 hours) at 11/09/2017 1131 Last data filed at 11/09/2017 1055 Gross per 24 hour  Intake 1711 ml  Output 2525 ml  Net -814 ml  Filed Weights   11/06/17 2100 11/08/17 0444 11/09/17 0500  Weight: 66.5 kg (146 lb 9.7 oz) 63.7 kg (140 lb 6.9 oz) 63.5 kg (139 lb 15.9 oz)    Examination:  General exam: Appears calm and comfortable  Respiratory system: Clear to auscultation. Respiratory effort normal. No  wheezing or crackle Cardiovascular system: S1 & S2 heard, irregular heart rate.  No pedal edema. Gastrointestinal system: Abdomen is nondistended, soft and nontender. Normal bowel sounds heard. Central nervous system: Alert and oriented. No focal neurological deficits. Extremities: Symmetric 5 x 5 power. Skin: No rashes, lesions or ulcers Psychiatry: Judgement and insight appear normal. Mood & affect appropriate.     Data Reviewed: I have personally reviewed following labs and imaging studies  CBC: Recent Labs  Lab 11/06/17 1542 11/08/17 0253 11/09/17 0257  WBC 11.1* 8.0 8.8  HGB 15.6 13.6 12.7*  HCT 47.1 42.2 39.1  MCV 96.5 98.4 99.0  PLT 136* 130* 130*   Basic Metabolic Panel: Recent Labs  Lab 11/06/17 1651 11/07/17 0731 11/07/17 0843 11/07/17 1843 11/08/17 0253 11/09/17 0257  NA 144 145  --  143 141 140  K 4.8 4.2  --  4.6 4.1 3.8  CL 108 108  --  105 101 97*  CO2 25 25  --  24 29 32  GLUCOSE 123* 121*  --  99 74 116*  BUN 42* 39*  --  41* 39* 37*  CREATININE 1.76* 1.58*  --  1.79* 1.70* 1.81*  CALCIUM 9.0 8.5*  --  8.3* 8.2* 7.9*  MG  --   --  2.0  --   --  1.6*   GFR: Estimated Creatinine Clearance: 28.3 mL/min (A) (by C-G formula based on SCr of 1.81 mg/dL (H)). Liver Function Tests: Recent Labs  Lab 11/06/17 1651 11/09/17 0257  AST 78* 53*  ALT 59 41  ALKPHOS 110 101  BILITOT 1.1 0.7  PROT 6.0* 4.8*  ALBUMIN 3.6 2.8*   No results for input(s): LIPASE, AMYLASE in the last 168 hours. Recent Labs  Lab 11/06/17 1651  AMMONIA 22   Coagulation Profile: Recent Labs  Lab 11/06/17 1935  INR 1.21   Cardiac Enzymes: Recent Labs  Lab 11/06/17 1935 11/07/17 0152 11/07/17 0731  TROPONINI 0.29* 0.44* 0.37*   BNP (last 3 results) No results for input(s): PROBNP in the last 8760 hours. HbA1C: Recent Labs    11/07/17 0152  HGBA1C 6.3*   CBG: Recent Labs  Lab 11/08/17 1632 11/08/17 2015 11/08/17 2319 11/09/17 0235 11/09/17 0836  GLUCAP  93 113* 103* 103* 91   Lipid Profile: Recent Labs    11/07/17 0201  CHOL 149  HDL 42  LDLCALC 94  TRIG 64  CHOLHDL 3.5   Thyroid Function Tests: Recent Labs    11/07/17 0152  TSH 1.340  FREET4 1.54*   Anemia Panel: Recent Labs    11/06/17 1935  VITAMINB12 931*   Sepsis Labs: Recent Labs  Lab 11/06/17 1609 11/06/17 1935 11/06/17 2228  PROCALCITON  --  0.10  --   LATICACIDVEN 2.99* 2.5* 2.3*    Recent Results (from the past 240 hour(s))  Culture, blood (Routine X 2) w Reflex to ID Panel     Status: None (Preliminary result)   Collection Time: 11/06/17  7:40 PM  Result Value Ref Range Status   Specimen Description BLOOD RIGHT ANTECUBITAL  Final   Special Requests   Final    BOTTLES DRAWN AEROBIC AND ANAEROBIC Blood Culture adequate volume  Culture NO GROWTH 2 DAYS  Final   Report Status PENDING  Incomplete  Culture, blood (Routine X 2) w Reflex to ID Panel     Status: None (Preliminary result)   Collection Time: 11/06/17  7:57 PM  Result Value Ref Range Status   Specimen Description BLOOD RIGHT HAND  Final   Special Requests IN PEDIATRIC BOTTLE Blood Culture adequate volume  Final   Culture NO GROWTH 2 DAYS  Final   Report Status PENDING  Incomplete  Urine Culture     Status: None   Collection Time: 11/07/17 12:55 AM  Result Value Ref Range Status   Specimen Description URINE, RANDOM  Final   Special Requests NONE  Final   Culture NO GROWTH  Final   Report Status 11/08/2017 FINAL  Final  MRSA PCR Screening     Status: None   Collection Time: 11/07/17  9:57 PM  Result Value Ref Range Status   MRSA by PCR NEGATIVE NEGATIVE Final    Comment:        The GeneXpert MRSA Assay (FDA approved for NASAL specimens only), is one component of a comprehensive MRSA colonization surveillance program. It is not intended to diagnose MRSA infection nor to guide or monitor treatment for MRSA infections.          Radiology Studies: Dg Chest Port 1  View  Result Date: 11/07/2017 CLINICAL DATA:  Shortness of breath, history CHF, hypertension, MI, chronic kidney disease EXAM: PORTABLE CHEST 1 VIEW COMPARISON:  Portable exam 1149 hours compared to 11/06/2017 FINDINGS: Enlargement of cardiac silhouette. Atherosclerotic calcification aorta. Slight pulmonary vascular congestion. Scarring in RIGHT upper lobe with mild bibasilar atelectasis. No definite acute infiltrate, pleural effusion or pneumothorax. Bones demineralized. IMPRESSION: Enlargement of cardiac silhouette with pulmonary vascular congestion. Mild bibasilar atelectasis. Electronically Signed   By: Ulyses Southward M.D.   On: 11/07/2017 12:20        Scheduled Meds: . aspirin  324 mg Oral Daily  . atorvastatin  40 mg Oral q1800  . isosorbide-hydrALAZINE  0.5 tablet Oral TID  . metoprolol succinate  25 mg Oral Daily  . mupirocin cream   Topical Daily  . nicotine  21 mg Transdermal Daily  . sodium chloride flush  3 mL Intravenous Q12H   Continuous Infusions: . sodium chloride    . sodium chloride 1 mL (11/09/17 0910)  . dextrose Stopped (11/09/17 0905)  . heparin 950 Units/hr (11/09/17 0400)     LOS: 3 days    [place] Ice Jaynie Collins, MD [facility] Pager [phone]  If 7PM-7AM, please contact night-coverage [email or web address] Password [facility] 11/09/2017, 11:31 AM

## 2017-11-09 NOTE — Progress Notes (Signed)
Physical Therapy Treatment Patient Details Name: [name] LernerBobby [name] MRN: [id number] DOB: 05/16/1933 Today's Date: 11/09/2017    History of Present Illness Pt is a 84 yo male admitted 10/27/17 with SOB, confusion and poor balance. Pt found to have a new CHF exacerbation. 2D echo on 1/14 revealed LVEF 15-20% complicated by Aflutter. Cardiology following and plan for left heart cath on Thursday 1/17. PMH includes CAD, HTN, MI, CKD and SOB.     PT Comments    Pt progressing with mobility. Demonstrates decreased safety awareness with ambulation, requiring intermittent minA to correct balance and steady RW. Continue to recommend SNF-level therapies at d/c to maximize functional mobility and independence prior to return home; pt in agreement with this, especially since he has unreliable support at home and will need to be mod indep. Will continue to follow acutely.   Follow Up Recommendations  SNF     Equipment Recommendations  Rolling walker with 5" wheels    Recommendations for Other Services       Precautions / Restrictions Precautions Precautions: Fall Restrictions Weight Bearing Restrictions: No    Mobility  Bed Mobility Overal bed mobility: Needs Assistance Bed Mobility: Supine to Sit     Supine to sit: Min guard        Transfers Overall transfer level: Needs assistance Equipment used: Rolling walker (2 wheeled) Transfers: Sit to/from Stand Sit to Stand: Min assist         General transfer comment: MinA for balance and to steady RW  Ambulation/Gait Ambulation/Gait assistance: Min assist Ambulation Distance (Feet): 250 Feet Assistive device: Rolling walker (2 wheeled) Gait Pattern/deviations: Step-through pattern;Decreased stride length;Scissoring Gait velocity: Decreased Gait velocity interpretation: <1.8 ft/sec, indicative of risk for recurrent falls General Gait Details: Slow, unstable amb with RW and intermittent minA for balance and to steady RW. Pt with decreased  safety and proximity to RW, especially with turns, requiring intermittent cues to correct.   Stairs            Wheelchair Mobility    Modified Rankin (Stroke Patients Only)       Balance Overall balance assessment: Needs assistance Sitting-balance support: Feet supported Sitting balance-Leahy Scale: Good       Standing balance-Leahy Scale: Poor                              Cognition Arousal/Alertness: Awake/alert Behavior During Therapy: WFL for tasks assessed/performed Overall Cognitive Status: Impaired/Different from baseline Area of Impairment: Safety/judgement                         Safety/Judgement: Decreased awareness of safety            Exercises      General Comments        Pertinent Vitals/Pain Pain Assessment: No/denies pain    Home Living                      Prior Function            PT Goals (current goals can now be found in the care plan section) Acute Rehab PT Goals Patient Stated Goal: Get stronger at rehab before going home PT Goal Formulation: With patient Time For Goal Achievement: 11/22/17 Potential to Achieve Goals: Good Progress towards PT goals: Progressing toward goals    Frequency    Min 2X/week      PT Plan Frequency needs to  be updated    Co-evaluation              AM-PAC PT "6 Clicks" Daily Activity  Outcome Measure  Difficulty turning over in bed (including adjusting bedclothes, sheets and blankets)?: None Difficulty moving from lying on back to sitting on the side of the bed? : A Little Difficulty sitting down on and standing up from a chair with arms (e.g., wheelchair, bedside commode, etc,.)?: Unable Help needed moving to and from a bed to chair (including a wheelchair)?: A Little Help needed walking in hospital room?: A Little Help needed climbing 3-5 steps with a railing? : A Lot 6 Click Score: 16    End of Session Equipment Utilized During Treatment: Gait  belt;Oxygen Activity Tolerance: Patient tolerated treatment well Patient left: in chair;with call bell/phone within reach;with family/visitor present Nurse Communication: Mobility status PT Visit Diagnosis: Unsteadiness on feet (R26.81);Muscle weakness (generalized) (M62.81)     Time: 8657-8469 PT Time Calculation (min) (ACUTE ONLY): 20 min  Charges:  $Gait Training: 8-22 mins                    G Codes:      Ina Homes, PT, DPT Acute Rehab Services  Pager: [phone]  Malachy Chamber 11/09/2017, 3:46 PM

## 2017-11-09 NOTE — Progress Notes (Signed)
Patient's heart monitor rang out V-Tach, upon assessment by RN, patient was non-responsive with clinched fists and breathing irregular.  Rapid Response and MD called.  Patient came to and rhythm returned to NSR.  Neuro exam is WDL.  Orders for magnesium level obtained.  Will continue to monitor.

## 2017-11-09 NOTE — H&P (View-Only) (Signed)
Subjective:  Feels better Breathing improved  Diuresing well Paroxysmal Afib/flutter  Cr 1.81  Objective:  Vital Signs in the last 24 hours: Temp:  [97.4 F (36.3 C)-98 F (36.7 C)] 97.6 F (36.4 C) (01/16 0236) Pulse Rate:  [70-89] 70 (01/16 0751) Resp:  [19-31] 31 (01/16 0751) BP: (91-129)/(46-112) 93/57 (01/16 0751) SpO2:  [97 %-98 %] 97 % (01/16 0751) Weight:  [63.5 kg (139 lb 15.9 oz)] 63.5 kg (139 lb 15.9 oz) (01/16 0500)  Intake/Output from previous day: 01/15 0701 - 01/16 0700 In: 1708 [P.O.:240; I.V.:1428] Out: 3000 [Urine:3000] Intake/Output from this shift: No intake/output data recorded.   Net -1.3 L  Physical Exam: Nursing note and vitals reviewed. Constitutional: He is oriented to person, place, and time. He appears well-developed and well-nourished. No distress.  HENT:  Head: Normocephalic and atraumatic.  Eyes: Pupils are equal, round, and reactive to light.  Neck: Normal range of motion. Neck supple. JVD present.  Cardiovascular: Normal rate and regular rhythm. Exam reveals no gallop.  II/VI holosystolic murmur RLSB Absent distal pulses. Chronic ischemic changes. No ulcers, no critical limb ischemia Respiratory: Effort normal. He has no wheezes. He has no rales. He exhibits no tenderness.  GI: Soft. Bowel sounds are normal. He exhibits distension. There is no tenderness.  Musculoskeletal: Normal range of motion. He exhibits no edema.  Neurological: He is alert and oriented to person, place, and time.  Skin: Skin is warm and dry.  Superficial abrasions bilateral shins   Psychiatric: He has a normal mood and affect.     Lab Results: Recent Labs    11/08/17 0253 11/09/17 0257  WBC 8.0 8.8  HGB 13.6 12.7*  PLT 130* 130*   Recent Labs    11/08/17 0253 11/09/17 0257  NA 141 140  K 4.1 3.8  CL 101 97*  CO2 29 32  GLUCOSE 74 116*  BUN 39* 37*  CREATININE 1.70* 1.81*   Recent Labs    11/07/17 0152 11/07/17 0731  TROPONINI 0.44* 0.37*    Hepatic Function Panel Recent Labs    11/09/17 0257  PROT 4.8*  ALBUMIN 2.8*  AST 53*  ALT 41  ALKPHOS 101  BILITOT 0.7   Recent Labs    11/07/17 0201  CHOL 149   No results for input(s): PROTIME in the last 72 hours.  Imaging: CXR 11/07/2017: CLINICAL DATA:  Shortness of breath, history CHF, hypertension, MI, chronic kidney disease  EXAM: PORTABLE CHEST 1 VIEW  COMPARISON:  Portable exam 1149 hours compared to 11/06/2017  FINDINGS: Enlargement of cardiac silhouette.  Atherosclerotic calcification aorta.  Slight pulmonary vascular congestion.  Scarring in RIGHT upper lobe with mild bibasilar atelectasis.  No definite acute infiltrate, pleural effusion or pneumothorax.  Bones demineralized.  IMPRESSION: Enlargement of cardiac silhouette with pulmonary vascular congestion.  Mild bibasilar atelectasis.   Cardiac Studies: Study Conclusions  - Left ventricle: The cavity size was mildly dilated. The estimated   ejection fraction was in the range of 15% to 20%. Severe diffuse   global hypokinesis. Grade II diastolic dysfunction. Elevated LAP. - Aortic valve: There was moderate regurgitation. - Mitral valve: Mildly dilated annulus. There was mild to moderate   regurgitation. - Left atrium: The atrium was moderately dilated. - Right ventricle: The cavity size was mildly dilated. Systolic   function was moderately reduced. - Right atrium: The atrium was mildly dilated. - Tricuspid valve: Mildly dilated annulus. There was   moderate-severe regurgitation. - Pulmonary arteries: PA peak pressure: 46 mm Hg (  S). May be   underestimated due to elevated RA pressure - Pericardium, extracardiac: There was a left pleural effusion.  Impressions:  - Severe biventricular failure.  Assessment:  [name] [name]   Acute on chronic systolic and diastolic heart failure: Biventricular failure EF 15-20% Etiology could be long standing  CAD, or nonischemic cardiomyopathy Paroxismal atrial flutter CHA2DS2VASc score 4  4.8% annual stroke risk Troponin elevation: Likely type II MI in the setting of heart failure Likely new baseline Cr with CKD, cardiorenal syndrome Tobacco abuse Possible PAD  Recommendations: Continue low dose bidil 20-37.5 1/2 tab tid for afterload reduction. Unable to use ACEiARB/ARNI at this time given acute kidney injury.  Switched metoprolol tartarate to succinate 25 mg daily. Stop IV lasix. Continue PO lasix 40 mg PO bid. Gentle hydration 50 cc/hr. Repeat Cr at noon. If improving, could consider cath later today. Keep NPO after breakfast. Echocardiogram reviewed  Continue heparin for anticoagulation. Recommend switching to renally dosed xarelto prior to discharge, prefereably after cath.  Continue aspirin, statin.    Heart failure diet. Maintain K around 4, Mg around 2.    LOS: 3 days    Anari Evitt J Eiliyah Reh 11/09/2017, 8:11 AM  Metztli Sachdev J Pedrohenrique Mccon[place], MD [facility] Pager: [phone] Office: [phone] If no answer Cell [phone]   

## 2017-11-09 NOTE — Evaluation (Signed)
Clinical/Bedside Swallow Evaluation Patient Details  Name: [name] LernerBobby [name] MRN: [id number] Date of Birth: 06/04/1933  Today's Date: 11/09/2017 Time: SLP Start Time (ACUTE ONLY): [id number] SLP Stop Time (ACUTE ONLY): 0948 SLP Time Calculation (min) (ACUTE ONLY): 9 min  Past Medical History:  Past Medical History:  Diagnosis Date  . CHF (congestive heart failure) (HCC)   . CKD (chronic kidney disease), stage II   . Heart attack (HCC)   . Hypertension    Past Surgical History:  Past Surgical History:  Procedure Laterality Date  . CORONARY STENT PLACEMENT     per patient's report   HPI:  [name] [name] a 84 y.o.malewith medical history significant ofhypertension, hypothyroidism, CAD, myocardial infarction,s/p ofstent placement, tobacco abuse, CKD-2, who presents with shortness breath, confusion,poor balance,fall and diarrhea. Recent diagnosis of new onset CHF. Found to havepositive troponin 0.13, abnormal liver functions, worsening renal function, chest-ray showed vascular congestion and a bilateral small pleural effusion. CT head is negative for acute intracranial abnormalities.   Assessment / Plan / Recommendation Clinical Impression  Pt without significant risk factors for aspiration in history. Dyspnea can cause disruption in coordination of swallow and respiration. Exhibited mild dyspnea during partial meal observation. Oral motor exam intact. No s/s aspiration during evaluation however significant continous coughing 5 minutes after assessment as SLP documenting outside pt's room although pt laying supine while being cleaned. Pt reports has difficulty masticating meats intermittnetly due to not using lower dentition when eating (has upper dentition); discussed downgrading to Dys 3 but concluded it may restrict food options too much. Pt can choose foods/meats that are softer or cut up meat. Continue regular/thin. Educated to take rest breaks if needed. Will follow up once more.   SLP Visit  Diagnosis: Dysphagia, unspecified (R13.10)    Aspiration Risk  Mild aspiration risk    Diet Recommendation Regular;Thin liquid   Liquid Administration via: Cup;Straw Medication Administration: Whole meds with liquid Supervision: Patient able to self feed Compensations: Slow rate;Small sips/bites Postural Changes: Seated upright at 90 degrees    Other  Recommendations Oral Care Recommendations: Oral care BID   Follow up Recommendations None      Frequency and Duration min 1 x/week  2 weeks       Prognosis Prognosis for Safe Diet Advancement: Good      Swallow Study   General HPI: [name] [name] a 84 y.o.malewith medical history significant ofhypertension, hypothyroidism, CAD, myocardial infarction,s/p ofstent placement, tobacco abuse, CKD-2, who presents with shortness breath, confusion,poor balance,fall and diarrhea. Recent diagnosis of new onset CHF. Found to havepositive troponin 0.13, abnormal liver functions, worsening renal function, chest-ray showed vascular congestion and a bilateral small pleural effusion. CT head is negative for acute intracranial abnormalities. Type of Study: Bedside Swallow Evaluation Previous Swallow Assessment: (none) Diet Prior to this Study: Regular;Thin liquids Temperature Spikes Noted: No Respiratory Status: Nasal cannula History of Recent Intubation: No Behavior/Cognition: Cooperative;Pleasant mood;Alert Oral Cavity Assessment: Within Functional Limits Oral Care Completed by SLP: No Oral Cavity - Dentition: Dentures, top(no lower dentition) Vision: Functional for self-feeding Self-Feeding Abilities: Able to feed self Patient Positioning: Upright in bed Baseline Vocal Quality: Normal Volitional Cough: Strong Volitional Swallow: Able to elicit    Oral/Motor/Sensory Function Overall Oral Motor/Sensory Function: Within functional limits   Ice Chips Ice chips: Not tested   Thin Liquid Thin Liquid: Within functional  limits Presentation: Straw Other Comments: (see impression statement)    Nectar Thick Nectar Thick Liquid: Not tested   Honey Thick Honey Thick Liquid: Not tested  Puree Puree: Not tested   Solid   GO   Solid: Within functional limits        [name] [name] 11/09/2017,10:07 AM  [name] [name].Ed [employer] [phone]

## 2017-11-09 NOTE — Progress Notes (Signed)
Subjective:  Feels better Breathing improved  Diuresing well Paroxysmal Afib/flutter  Cr 1.81  Objective:  Vital Signs in the last 24 hours: Temp:  [97.4 F (36.3 C)-98 F (36.7 C)] 97.6 F (36.4 C) (01/16 0236) Pulse Rate:  [70-89] 70 (01/16 0751) Resp:  [19-31] 31 (01/16 0751) BP: (91-129)/(46-112) 93/57 (01/16 0751) SpO2:  [97 %-98 %] 97 % (01/16 0751) Weight:  [63.5 kg (139 lb 15.9 oz)] 63.5 kg (139 lb 15.9 oz) (01/16 0500)  Intake/Output from previous day: 01/15 0701 - 01/16 0700 In: 1708 [P.O.:240; I.V.:1428] Out: 3000 [Urine:3000] Intake/Output from this shift: No intake/output data recorded.   Net -1.3 L  Physical Exam: Nursing note and vitals reviewed. Constitutional: He is oriented to person, place, and time. He appears well-developed and well-nourished. No distress.  HENT:  Head: Normocephalic and atraumatic.  Eyes: Pupils are equal, round, and reactive to light.  Neck: Normal range of motion. Neck supple. JVD present.  Cardiovascular: Normal rate and regular rhythm. Exam reveals no gallop.  II/VI holosystolic murmur RLSB Absent distal pulses. Chronic ischemic changes. No ulcers, no critical limb ischemia Respiratory: Effort normal. He has no wheezes. He has no rales. He exhibits no tenderness.  GI: Soft. Bowel sounds are normal. He exhibits distension. There is no tenderness.  Musculoskeletal: Normal range of motion. He exhibits no edema.  Neurological: He is alert and oriented to person, place, and time.  Skin: Skin is warm and dry.  Superficial abrasions bilateral shins   Psychiatric: He has a normal mood and affect.     Lab Results: Recent Labs    11/08/17 0253 11/09/17 0257  WBC 8.0 8.8  HGB 13.6 12.7*  PLT 130* 130*   Recent Labs    11/08/17 0253 11/09/17 0257  NA 141 140  K 4.1 3.8  CL 101 97*  CO2 29 32  GLUCOSE 74 116*  BUN 39* 37*  CREATININE 1.70* 1.81*   Recent Labs    11/07/17 0152 11/07/17 0731  TROPONINI 0.44* 0.37*    Hepatic Function Panel Recent Labs    11/09/17 0257  PROT 4.8*  ALBUMIN 2.8*  AST 53*  ALT 41  ALKPHOS 101  BILITOT 0.7   Recent Labs    11/07/17 0201  CHOL 149   No results for input(s): PROTIME in the last 72 hours.  Imaging: CXR 11/07/2017: CLINICAL DATA:  Shortness of breath, history CHF, hypertension, MI, chronic kidney disease  EXAM: PORTABLE CHEST 1 VIEW  COMPARISON:  Portable exam 1149 hours compared to 11/06/2017  FINDINGS: Enlargement of cardiac silhouette.  Atherosclerotic calcification aorta.  Slight pulmonary vascular congestion.  Scarring in RIGHT upper lobe with mild bibasilar atelectasis.  No definite acute infiltrate, pleural effusion or pneumothorax.  Bones demineralized.  IMPRESSION: Enlargement of cardiac silhouette with pulmonary vascular congestion.  Mild bibasilar atelectasis.   Cardiac Studies: Study Conclusions  - Left ventricle: The cavity size was mildly dilated. The estimated   ejection fraction was in the range of 15% to 20%. Severe diffuse   global hypokinesis. Grade II diastolic dysfunction. Elevated LAP. - Aortic valve: There was moderate regurgitation. - Mitral valve: Mildly dilated annulus. There was mild to moderate   regurgitation. - Left atrium: The atrium was moderately dilated. - Right ventricle: The cavity size was mildly dilated. Systolic   function was moderately reduced. - Right atrium: The atrium was mildly dilated. - Tricuspid valve: Mildly dilated annulus. There was   moderate-severe regurgitation. - Pulmonary arteries: PA peak pressure: 46 mm Hg (  S). May be   underestimated due to elevated RA pressure - Pericardium, extracardiac: There was a left pleural effusion.  Impressions:  - Severe biventricular failure.  Assessment:  84 year old African-American male   Acute on chronic systolic and diastolic heart failure: Biventricular failure EF 15-20% Etiology could be long standing  CAD, or nonischemic cardiomyopathy Paroxismal atrial flutter CHA2DS2VASc score 4  4.8% annual stroke risk Troponin elevation: Likely type II MI in the setting of heart failure Likely new baseline Cr with CKD, cardiorenal syndrome Tobacco abuse Possible PAD  Recommendations: Continue low dose bidil 20-37.5 1/2 tab tid for afterload reduction. Unable to use ACEiARB/ARNI at this time given acute kidney injury.  Switched metoprolol tartarate to succinate 25 mg daily. Stop IV lasix. Continue PO lasix 40 mg PO bid. Gentle hydration 50 cc/hr. Repeat Cr at noon. If improving, could consider cath later today. Keep NPO after breakfast. Echocardiogram reviewed  Continue heparin for anticoagulation. Recommend switching to renally dosed xarelto prior to discharge, prefereably after cath.  Continue aspirin, statin.    Heart failure diet. Maintain K around 4, Mg around 2.    LOS: 3 days    Zuzu Befort J Delano Scardino 11/09/2017, 8:11 AM  Juandedios Dudash Emiliano Dyer, MD [facility] [place] Pager: [phone] Office: [phone] If no answer Cell [phone]

## 2017-11-10 ENCOUNTER — Inpatient Hospital Stay (HOSPITAL_COMMUNITY): Payer: Medicare HMO

## 2017-11-10 ENCOUNTER — Encounter (HOSPITAL_COMMUNITY)
Admission: EM | Disposition: A | Discharge status: Hospice | Payer: Self-pay | Source: Home / Self Care | Attending: Nephrology

## 2017-11-10 ENCOUNTER — Encounter (HOSPITAL_COMMUNITY): Payer: Self-pay | Admitting: Cardiology

## 2017-11-10 DIAGNOSIS — I5023 Acute on chronic systolic (congestive) heart failure: Secondary | ICD-10-CM

## 2017-11-10 DIAGNOSIS — I25110 Atherosclerotic heart disease of native coronary artery with unstable angina pectoris: Secondary | ICD-10-CM

## 2017-11-10 HISTORY — PX: RIGHT/LEFT HEART CATH AND CORONARY ANGIOGRAPHY: CATH118266

## 2017-11-10 LAB — POCT I-STAT 3, VENOUS BLOOD GAS (G3P V)
Acid-Base Excess: 8 mmol/L — ABNORMAL HIGH (ref 0.0–2.0)
Bicarbonate: 35.2 mmol/L — ABNORMAL HIGH (ref 20.0–28.0)
O2 SAT: 53 %
TCO2: 37 mmol/L — AB (ref 22–32)
pCO2, Ven: 59.3 mmHg (ref 44.0–60.0)
pH, Ven: 7.381 (ref 7.250–7.430)
pO2, Ven: 30 mmHg — CL (ref 32.0–45.0)

## 2017-11-10 LAB — BASIC METABOLIC PANEL
Anion gap: 10 (ref 5–15)
BUN: 30 mg/dL — ABNORMAL HIGH (ref 6–20)
CHLORIDE: 97 mmol/L — AB (ref 101–111)
CO2: 31 mmol/L (ref 22–32)
CREATININE: 1.42 mg/dL — AB (ref 0.61–1.24)
Calcium: 7.8 mg/dL — ABNORMAL LOW (ref 8.9–10.3)
GFR calc Af Amer: 52 mL/min — ABNORMAL LOW (ref >60)
GFR calc non Af Amer: 44 mL/min — ABNORMAL LOW (ref >60)
Glucose, Bld: 115 mg/dL — ABNORMAL HIGH (ref 65–99)
Potassium: 3.6 mmol/L (ref 3.5–5.1)
SODIUM: 138 mmol/L (ref 135–145)

## 2017-11-10 LAB — POCT I-STAT 3, ART BLOOD GAS (G3+)
ACID-BASE EXCESS: 8 mmol/L — AB (ref 0.0–2.0)
ACID-BASE EXCESS: 8 mmol/L — AB (ref 0.0–2.0)
BICARBONATE: 33.2 mmol/L — AB (ref 20.0–28.0)
Bicarbonate: 33.6 mmol/L — ABNORMAL HIGH (ref 20.0–28.0)
O2 SAT: 90 %
O2 Saturation: 96 %
TCO2: 35 mmol/L — AB (ref 22–32)
TCO2: 35 mmol/L — ABNORMAL HIGH (ref 22–32)
pCO2 arterial: 49.1 mmHg — ABNORMAL HIGH (ref 32.0–48.0)
pCO2 arterial: 49.3 mmHg — ABNORMAL HIGH (ref 32.0–48.0)
pH, Arterial: 7.438 (ref 7.350–7.450)
pH, Arterial: 7.441 (ref 7.350–7.450)
pO2, Arterial: 58 mmHg — ABNORMAL LOW (ref 83.0–108.0)
pO2, Arterial: 80 mmHg — ABNORMAL LOW (ref 83.0–108.0)

## 2017-11-10 LAB — CSF CELL COUNT WITH DIFFERENTIAL
RBC COUNT CSF: 340 /mm3 — AB
Tube #: 1
WBC CSF: 1 /mm3 (ref 0–5)

## 2017-11-10 LAB — CBC
HEMATOCRIT: 39.9 % (ref 39.0–52.0)
Hemoglobin: 12.9 g/dL — ABNORMAL LOW (ref 13.0–17.0)
MCH: 31.2 pg (ref 26.0–34.0)
MCHC: 32.3 g/dL (ref 30.0–36.0)
MCV: 96.6 fL (ref 78.0–100.0)
Platelets: 139 10*3/uL — ABNORMAL LOW (ref 150–400)
RBC: 4.13 MIL/uL — ABNORMAL LOW (ref 4.22–5.81)
RDW: 14.8 % (ref 11.5–15.5)
WBC: 7.7 10*3/uL (ref 4.0–10.5)

## 2017-11-10 LAB — GLUCOSE, CAPILLARY
GLUCOSE-CAPILLARY: 96 mg/dL (ref 65–99)
GLUCOSE-CAPILLARY: 91 mg/dL (ref 65–99)
GLUCOSE-CAPILLARY: 130 mg/dL — AB (ref 65–99)
Glucose-Capillary: 127 mg/dL — ABNORMAL HIGH (ref 65–99)

## 2017-11-10 LAB — HEPARIN LEVEL (UNFRACTIONATED): HEPARIN UNFRACTIONATED: 0.34 [IU]/mL (ref 0.30–0.70)

## 2017-11-10 LAB — MAGNESIUM: Magnesium: 2 mg/dL (ref 1.7–2.4)

## 2017-11-10 LAB — PROTEIN, CSF: Total  Protein, CSF: 32 mg/dL (ref 15–45)

## 2017-11-10 LAB — GLUCOSE, CSF: GLUCOSE CSF: 77 mg/dL — AB (ref 40–70)

## 2017-11-10 LAB — POCT ACTIVATED CLOTTING TIME: ACTIVATED CLOTTING TIME: 180 s

## 2017-11-10 SURGERY — RIGHT/LEFT HEART CATH AND CORONARY ANGIOGRAPHY
Anesthesia: LOCAL

## 2017-11-10 SURGERY — LEFT HEART CATH AND CORONARY ANGIOGRAPHY
Anesthesia: LOCAL

## 2017-11-10 MED ORDER — SODIUM CHLORIDE 0.9 % IV SOLN
INTRAVENOUS | Status: DC
  Administered 2017-11-10: 07:00:00 via INTRAVENOUS

## 2017-11-10 MED ORDER — LIDOCAINE HCL (PF) 1 % IJ SOLN
5.0000 mL | Freq: Once | INTRAMUSCULAR | Status: AC
  Filled 2017-11-10: qty 5

## 2017-11-10 MED ORDER — SODIUM CHLORIDE 0.9 % IV SOLN
INTRAVENOUS | Status: AC
  Administered 2017-11-10: 13:00:00 via INTRAVENOUS

## 2017-11-10 MED ORDER — SODIUM CHLORIDE 0.9% FLUSH: 3.0000 mL | INTRAVENOUS | Status: DC | PRN

## 2017-11-10 MED ORDER — SODIUM CHLORIDE 0.9 % IV SOLN: 250.0000 mL | INTRAVENOUS | Status: DC | PRN

## 2017-11-10 MED ORDER — HEPARIN (PORCINE) IN NACL 2-0.9 UNIT/ML-% IJ SOLN
INTRAMUSCULAR | Status: AC | PRN
  Administered 2017-11-10: 1000 mL

## 2017-11-10 MED ORDER — MILRINONE LACTATE IN DEXTROSE 20-5 MG/100ML-% IV SOLN
0.2500 ug/kg/min | INTRAVENOUS | Status: DC
  Administered 2017-11-10 – 2017-11-11 (×3): 0.25 ug/kg/min via INTRAVENOUS
  Filled 2017-11-10 (×2): qty 100

## 2017-11-10 MED ORDER — ASPIRIN 81 MG PO CHEW
81.0000 mg | CHEWABLE_TABLET | ORAL | Status: AC
  Administered 2017-11-10: 81 mg via ORAL
  Filled 2017-11-10: qty 1

## 2017-11-10 MED ORDER — SODIUM CHLORIDE 0.9% FLUSH
3.0000 mL | Freq: Two times a day (BID) | INTRAVENOUS | Status: DC
  Administered 2017-11-10 (×2): 3 mL via INTRAVENOUS

## 2017-11-10 MED ORDER — FUROSEMIDE 40 MG PO TABS
40.0000 mg | ORAL_TABLET | Freq: Two times a day (BID) | ORAL | Status: DC
  Administered 2017-11-10 – 2017-11-11 (×3): 40 mg via ORAL
  Filled 2017-11-10 (×3): qty 1

## 2017-11-10 MED ORDER — HEPARIN SODIUM (PORCINE) 1000 UNIT/ML IJ SOLN
INTRAMUSCULAR | Status: DC | PRN
  Administered 2017-11-10: 3000 [IU] via INTRAVENOUS

## 2017-11-10 MED ORDER — FENTANYL CITRATE (PF) 100 MCG/2ML IJ SOLN
INTRAMUSCULAR | Status: AC
  Filled 2017-11-10: qty 2

## 2017-11-10 MED ORDER — SODIUM CHLORIDE 0.9% FLUSH: 3.0000 mL | Freq: Two times a day (BID) | INTRAVENOUS | Status: DC

## 2017-11-10 MED ORDER — LIDOCAINE HCL (PF) 1 % IJ SOLN
INTRAMUSCULAR | Status: AC
  Administered 2017-11-10: 2 mL via INTRATHECAL
  Filled 2017-11-10: qty 5

## 2017-11-10 MED ORDER — HEPARIN SODIUM (PORCINE) 1000 UNIT/ML IJ SOLN
INTRAMUSCULAR | Status: AC
  Filled 2017-11-10: qty 1

## 2017-11-10 MED ORDER — MIDAZOLAM HCL 2 MG/2ML IJ SOLN
INTRAMUSCULAR | Status: AC
  Filled 2017-11-10: qty 2

## 2017-11-10 MED ORDER — LIDOCAINE HCL (PF) 1 % IJ SOLN
5.0000 mL | Freq: Once | INTRAMUSCULAR | Status: AC
  Administered 2017-11-10: 5 mL via INTRADERMAL

## 2017-11-10 MED ORDER — LIDOCAINE HCL (PF) 1 % IJ SOLN
INTRAMUSCULAR | Status: AC
  Administered 2017-11-10: 5 mL via INTRADERMAL
  Filled 2017-11-10: qty 5

## 2017-11-10 MED ORDER — FENTANYL CITRATE (PF) 100 MCG/2ML IJ SOLN
INTRAMUSCULAR | Status: DC | PRN
  Administered 2017-11-10 (×2): 25 ug via INTRAVENOUS

## 2017-11-10 MED ORDER — HEPARIN (PORCINE) IN NACL 100-0.45 UNIT/ML-% IJ SOLN
1050.0000 [IU]/h | INTRAMUSCULAR | Status: DC
  Administered 2017-11-10: 950 [IU]/h via INTRAVENOUS
  Administered 2017-11-11: 1050 [IU]/h via INTRAVENOUS
  Filled 2017-11-10: qty 250

## 2017-11-10 MED ORDER — IOPAMIDOL (ISOVUE-370) INJECTION 76%
INTRAVENOUS | Status: AC
  Filled 2017-11-10: qty 100

## 2017-11-10 MED ORDER — IOPAMIDOL (ISOVUE-370) INJECTION 76%
INTRAVENOUS | Status: DC | PRN
  Administered 2017-11-10: 50 mL via INTRA_ARTERIAL

## 2017-11-10 MED ORDER — VERAPAMIL HCL 2.5 MG/ML IV SOLN
INTRA_ARTERIAL | Status: DC | PRN
  Administered 2017-11-10: 10 mL via INTRA_ARTERIAL

## 2017-11-10 MED ORDER — ACETAMINOPHEN 325 MG PO TABS: 650.0000 mg | ORAL_TABLET | ORAL | Status: DC | PRN

## 2017-11-10 MED ORDER — METOPROLOL TARTRATE 5 MG/5ML IV SOLN
5.0000 mg | Freq: Once | INTRAVENOUS | Status: AC
  Administered 2017-11-11: 5 mg via INTRAVENOUS

## 2017-11-10 MED ORDER — ONDANSETRON HCL 4 MG/2ML IJ SOLN: 4.0000 mg | Freq: Four times a day (QID) | INTRAMUSCULAR | Status: DC | PRN

## 2017-11-10 MED ORDER — LIDOCAINE HCL (PF) 1 % IJ SOLN
INTRAMUSCULAR | Status: DC | PRN
  Administered 2017-11-10: 5 mL via SUBCUTANEOUS
  Administered 2017-11-10: 10 mL via SUBCUTANEOUS
  Administered 2017-11-10: 2 mL via INTRATHECAL
  Administered 2017-11-10: 4 mL via SUBCUTANEOUS

## 2017-11-10 MED ORDER — HEPARIN (PORCINE) IN NACL 2-0.9 UNIT/ML-% IJ SOLN
INTRAMUSCULAR | Status: AC
  Filled 2017-11-10: qty 1000

## 2017-11-10 MED ORDER — VERAPAMIL HCL 2.5 MG/ML IV SOLN
INTRAVENOUS | Status: AC
  Filled 2017-11-10: qty 2

## 2017-11-10 MED ORDER — MIDAZOLAM HCL 2 MG/2ML IJ SOLN
INTRAMUSCULAR | Status: DC | PRN
  Administered 2017-11-10: 1 mg via INTRAVENOUS

## 2017-11-10 MED ORDER — LIDOCAINE HCL (PF) 1 % IJ SOLN
INTRAMUSCULAR | Status: AC
  Filled 2017-11-10: qty 30

## 2017-11-10 MED ORDER — DOPAMINE-DEXTROSE 3.2-5 MG/ML-% IV SOLN
2.5000 ug/kg/min | INTRAVENOUS | Status: DC
  Administered 2017-11-10: 2.5 ug/kg/min via INTRAVENOUS
  Administered 2017-11-12: 7.5 ug/kg/min via INTRAVENOUS
  Administered 2017-11-12: 10 ug/kg/min via INTRAVENOUS
  Filled 2017-11-10: qty 250

## 2017-11-10 SURGICAL SUPPLY — 21 items
CATH BALLN WEDGE 5F 110CM (CATHETERS) ×2 IMPLANT
CATH INFINITI 5 FR JL3.5 (CATHETERS) ×2 IMPLANT
CATH INFINITI 5FR JL4 (CATHETERS) ×2 IMPLANT
CATH INFINITI JR4 5F (CATHETERS) ×2 IMPLANT
CATH SWAN GANZ 7F STRAIGHT (CATHETERS) ×2 IMPLANT
COVER PRB 48X5XTLSCP FOLD TPE (BAG) ×2 IMPLANT
COVER PROBE 5X48 (BAG) ×2
DEVICE RAD COMP TR BAND LRG (VASCULAR PRODUCTS) ×2 IMPLANT
GLIDESHEATH SLEND A-KIT 6F 22G (SHEATH) ×2 IMPLANT
GUIDEWIRE .025 260CM (WIRE) ×2 IMPLANT
GUIDEWIRE INQWIRE 1.5J.035X260 (WIRE) ×1 IMPLANT
INQWIRE 1.5J .035X260CM (WIRE) ×2
KIT HEART LEFT (KITS) ×2 IMPLANT
KIT MICROINTRODUCER STIFF 5F (SHEATH) ×4 IMPLANT
PACK CARDIAC CATHETERIZATION (CUSTOM PROCEDURE TRAY) ×2 IMPLANT
SHEATH GLIDE SLENDER 4/5FR (SHEATH) ×2 IMPLANT
SHEATH PINNACLE 7F 10CM (SHEATH) ×2 IMPLANT
TRANSDUCER W/STOPCOCK (MISCELLANEOUS) ×2 IMPLANT
TUBING CIL FLEX 10 FLL-RA (TUBING) ×2 IMPLANT
WIRE EMERALD 3MM-J .025X260CM (WIRE) ×2 IMPLANT
WIRE MICROINTRODUCER 60CM (WIRE) ×2 IMPLANT

## 2017-11-10 NOTE — Procedures (Signed)
Procedure: Lumbar Puncture w Fluoro Guidance at L1-2. Specimen: CSF, to lab Bleeding: minimal. Complications: None immediate. Patient   -Condition: Stable.  -Disposition:  To floor.  Full Radiology Report to follow under IMAGING

## 2017-11-10 NOTE — Consult Note (Signed)
[address]       [place] [address]             [phone]      Cardiothoracic Surgery Consultation  Reason for Consult: severe multi-vessel coronary artery disease Referring Physician: Dr. Jerilynn Mages. Patwardhan  [name] [name] is an 84 y.o. male.  HPI:   The patient is an 84 year old gentleman with a history of hypertension, stage II chronic kidney disease, hypothyroidism, coronary artery disease and prior myocardial infarction status post stenting in 2009, and ongoing tobacco abuse who presented with a one-month history of progressive shortness of breath with lower extremity edema, confusion, and poor balance with a fall.  On admission his troponin was 0.13 and his BNP was 2055.  He had mildly elevated transaminases with an AST of 78.  A chest x-ray showed vascular congestion and small bilateral pleural effusions.  An echocardiogram on 11/07/2017 showed severe left ventricular dysfunction with ejection fraction of 15-20% with diffuse global hypokinesis.  There is grade 2 diastolic dysfunction.  There is moderate aortic insufficiency and mild to moderate mitral regurgitation.  There is moderate to severe tricuspid regurgitation with a mildly dilated annulus and right ventricular function was moderately reduced.  He underwent cardiac catheterization today which showed a 90% stenosis in the proximal to mid LAD, a 90% stenosis in size ramus branch, and an occluded proximal right coronary artery with collaterals to the distal vessel from the LAD.  The LVEDP was elevated at 32.  Right atrial pressure was elevated at 15 mmHg.  It was not possible to navigate the pulmonary artery catheter into the main pulmonary artery.  The measured cardiac index of 1.7.  The mixed venous saturation was 53%.  He says that his lower extremity edema has markedly improved with diuresis.  Past Medical History:  Diagnosis Date  . CHF (congestive heart failure) ([place])   . CKD (chronic kidney disease), stage II    . Heart attack ([place])   . Hypertension     Past Surgical History:  Procedure Laterality Date  . CORONARY STENT PLACEMENT     per patient's report  . RIGHT/LEFT HEART CATH AND CORONARY ANGIOGRAPHY N/A 11/10/2017   Procedure: RIGHT/LEFT HEART CATH AND CORONARY ANGIOGRAPHY;  Surgeon: Nigel Mormon, MD;  Location: [facility];  Service: Cardiovascular;  Laterality: N/A;    Family History  Problem Relation Age of Onset  . Aneurysm Mother   . Heart failure Brother     Social History:  reports that he has been smoking.  He has a 35.00 pack-year smoking history. he has never used smokeless tobacco. He reports that he does not drink alcohol or use drugs.  Allergies: No Known Allergies  Medications:  I have reviewed the patient's current medications. Prior to Admission:  Medications Prior to Admission  Medication Sig Dispense Refill Last Dose  . albuterol (PROVENTIL HFA;VENTOLIN HFA) 108 (90 Base) MCG/ACT inhaler Inhale 2 puffs into the lungs every 4 (four) hours as needed for wheezing or shortness of breath. 1 Inhaler 1 11/06/2017 at prn  . furosemide (LASIX) 20 MG tablet Take 20 mg by mouth daily.  0 11/06/2017 at Unknown time  . metoprolol tartrate (LOPRESSOR) 25 MG tablet Take 25 mg by mouth 2 (two) times daily.  4 11/06/2017 at 1100  . potassium chloride (K-DUR) 10 MEQ tablet Take 10 mEq by mouth 2 (two) times daily.  0 11/06/2017 at Unknown time  . budesonide-formoterol (SYMBICORT) 160-4.5 MCG/ACT inhaler  Inhale 2 puffs into the lungs 2 (two) times daily. (Patient not taking: Reported on 11/06/2017) 1 Inhaler 0 Not Taking at Unknown time   Scheduled: . aspirin  324 mg Oral Daily  . atorvastatin  40 mg Oral q1800  . furosemide  40 mg Oral BID  . isosorbide-hydrALAZINE  0.5 tablet Oral TID  . mupirocin cream   Topical Daily  . nicotine  21 mg Transdermal Daily  . sodium chloride flush  3 mL Intravenous Q12H  . sodium chloride flush  3 mL Intravenous Q12H    Continuous: . sodium chloride    . sodium chloride    . dextrose 30 mL/hr at 11/10/17 1832  . DOPamine 2.5 mcg/kg/min (11/10/17 1831)  . heparin    . milrinone 0.25 mcg/kg/min (11/10/17 1830)   QQI:WLNLGX chloride, sodium chloride, acetaminophen, dextromethorphan-guaiFENesin, hydrALAZINE, ipratropium-albuterol, metoprolol tartrate, morphine injection, nitroGLYCERIN, ondansetron (ZOFRAN) IV, ondansetron (ZOFRAN) IV, sodium chloride flush, sodium chloride flush Anti-infectives (From admission, onward)   None      Results for orders placed or performed during the hospital encounter of 11/06/17 (from the past 48 hour(s))  Glucose, capillary     Status: Abnormal   Collection Time: 11/08/17  8:15 PM  Result Value Ref Range   Glucose-Capillary 113 (H) 65 - 99 mg/dL  Glucose, capillary     Status: Abnormal   Collection Time: 11/08/17 11:19 PM  Result Value Ref Range   Glucose-Capillary 103 (H) 65 - 99 mg/dL  Glucose, capillary     Status: Abnormal   Collection Time: 11/09/17  2:35 AM  Result Value Ref Range   Glucose-Capillary 103 (H) 65 - 99 mg/dL  Heparin level (unfractionated)     Status: None   Collection Time: 11/09/17  2:57 AM  Result Value Ref Range   Heparin Unfractionated 0.49 0.30 - 0.70 IU/mL    Comment:        IF HEPARIN RESULTS ARE BELOW EXPECTED VALUES, AND PATIENT DOSAGE HAS BEEN CONFIRMED, SUGGEST FOLLOW UP TESTING OF ANTITHROMBIN III LEVELS.   CBC     Status: Abnormal   Collection Time: 11/09/17  2:57 AM  Result Value Ref Range   WBC 8.8 4.0 - 10.5 K/uL   RBC 3.95 (L) 4.22 - 5.81 MIL/uL   Hemoglobin 12.7 (L) 13.0 - 17.0 g/dL   HCT 39.1 39.0 - 52.0 %   MCV 99.0 78.0 - 100.0 fL   MCH 32.2 26.0 - 34.0 pg   MCHC 32.5 30.0 - 36.0 g/dL   RDW 14.8 11.5 - 15.5 %   Platelets 130 (L) 150 - 400 K/uL  Comprehensive metabolic panel     Status: Abnormal   Collection Time: 11/09/17  2:57 AM  Result Value Ref Range   Sodium 140 135 - 145 mmol/L   Potassium 3.8 3.5 -  5.1 mmol/L   Chloride 97 (L) 101 - 111 mmol/L   CO2 32 22 - 32 mmol/L   Glucose, Bld 116 (H) 65 - 99 mg/dL   BUN 37 (H) 6 - 20 mg/dL   Creatinine, Ser 1.81 (H) 0.61 - 1.24 mg/dL   Calcium 7.9 (L) 8.9 - 10.3 mg/dL   Total Protein 4.8 (L) 6.5 - 8.1 g/dL   Albumin 2.8 (L) 3.5 - 5.0 g/dL   AST 53 (H) 15 - 41 U/L   ALT 41 17 - 63 U/L   Alkaline Phosphatase 101 38 - 126 U/L   Total Bilirubin 0.7 0.3 - 1.2 mg/dL   GFR calc non Af  Amer 33 (L) >60 mL/min   GFR calc Af Amer 38 (L) >60 mL/min    Comment: (NOTE) The eGFR has been calculated using the CKD EPI equation. This calculation has not been validated in all clinical situations. eGFR's persistently <60 mL/min signify possible Chronic Kidney Disease.    Anion gap 11 5 - 15  Magnesium     Status: Abnormal   Collection Time: 11/09/17  2:57 AM  Result Value Ref Range   Magnesium 1.6 (L) 1.7 - 2.4 mg/dL  Glucose, capillary     Status: None   Collection Time: 11/09/17  8:36 AM  Result Value Ref Range   Glucose-Capillary 91 65 - 99 mg/dL  Glucose, capillary     Status: Abnormal   Collection Time: 11/09/17 11:47 AM  Result Value Ref Range   Glucose-Capillary 125 (H) 65 - 99 mg/dL  Glucose, capillary     Status: Abnormal   Collection Time: 11/09/17  4:51 PM  Result Value Ref Range   Glucose-Capillary 112 (H) 65 - 99 mg/dL  Creatinine, serum     Status: Abnormal   Collection Time: 11/09/17  6:13 PM  Result Value Ref Range   Creatinine, Ser 1.62 (H) 0.61 - 1.24 mg/dL   GFR calc non Af Amer 38 (L) >60 mL/min   GFR calc Af Amer 44 (L) >60 mL/min    Comment: (NOTE) The eGFR has been calculated using the CKD EPI equation. This calculation has not been validated in all clinical situations. eGFR's persistently <60 mL/min signify possible Chronic Kidney Disease.   Glucose, capillary     Status: Abnormal   Collection Time: 11/09/17  7:35 PM  Result Value Ref Range   Glucose-Capillary 105 (H) 65 - 99 mg/dL  Glucose, capillary     Status:  Abnormal   Collection Time: 11/09/17 11:08 PM  Result Value Ref Range   Glucose-Capillary 110 (H) 65 - 99 mg/dL  Heparin level (unfractionated)     Status: None   Collection Time: 11/10/17  3:03 AM  Result Value Ref Range   Heparin Unfractionated 0.34 0.30 - 0.70 IU/mL    Comment:        IF HEPARIN RESULTS ARE BELOW EXPECTED VALUES, AND PATIENT DOSAGE HAS BEEN CONFIRMED, SUGGEST FOLLOW UP TESTING OF ANTITHROMBIN III LEVELS.   Basic metabolic panel     Status: Abnormal   Collection Time: 11/10/17  3:05 AM  Result Value Ref Range   Sodium 138 135 - 145 mmol/L   Potassium 3.6 3.5 - 5.1 mmol/L   Chloride 97 (L) 101 - 111 mmol/L   CO2 31 22 - 32 mmol/L   Glucose, Bld 115 (H) 65 - 99 mg/dL   BUN 30 (H) 6 - 20 mg/dL   Creatinine, Ser 1.42 (H) 0.61 - 1.24 mg/dL   Calcium 7.8 (L) 8.9 - 10.3 mg/dL   GFR calc non Af Amer 44 (L) >60 mL/min   GFR calc Af Amer 52 (L) >60 mL/min    Comment: (NOTE) The eGFR has been calculated using the CKD EPI equation. This calculation has not been validated in all clinical situations. eGFR's persistently <60 mL/min signify possible Chronic Kidney Disease.    Anion gap 10 5 - 15  CBC     Status: Abnormal   Collection Time: 11/10/17  3:05 AM  Result Value Ref Range   WBC 7.7 4.0 - 10.5 K/uL   RBC 4.13 (L) 4.22 - 5.81 MIL/uL   Hemoglobin 12.9 (L) 13.0 - 17.0 g/dL  HCT 39.9 39.0 - 52.0 %   MCV 96.6 78.0 - 100.0 fL   MCH 31.2 26.0 - 34.0 pg   MCHC 32.3 30.0 - 36.0 g/dL   RDW 14.8 11.5 - 15.5 %   Platelets 139 (L) 150 - 400 K/uL  Magnesium     Status: None   Collection Time: 11/10/17  3:05 AM  Result Value Ref Range   Magnesium 2.0 1.7 - 2.4 mg/dL  Glucose, capillary     Status: None   Collection Time: 11/10/17  4:04 AM  Result Value Ref Range   Glucose-Capillary 96 65 - 99 mg/dL  I-STAT 3, arterial blood gas (G3+)     Status: Abnormal   Collection Time: 11/10/17  8:33 AM  Result Value Ref Range   pH, Arterial 7.438 7.350 - 7.450   pCO2  arterial 49.1 (H) 32.0 - 48.0 mmHg   pO2, Arterial 58.0 (L) 83.0 - 108.0 mmHg   Bicarbonate 33.2 (H) 20.0 - 28.0 mmol/L   TCO2 35 (H) 22 - 32 mmol/L   O2 Saturation 90.0 %   Acid-Base Excess 8.0 (H) 0.0 - 2.0 mmol/L   Patient temperature HIDE    Sample type ARTERIAL   I-STAT 3, venous blood gas (G3P V)     Status: Abnormal   Collection Time: 11/10/17  9:15 AM  Result Value Ref Range   pH, Ven 7.381 7.250 - 7.430   pCO2, Ven 59.3 44.0 - 60.0 mmHg   pO2, Ven 30.0 (LL) 32.0 - 45.0 mmHg   Bicarbonate 35.2 (H) 20.0 - 28.0 mmol/L   TCO2 37 (H) 22 - 32 mmol/L   O2 Saturation 53.0 %   Acid-Base Excess 8.0 (H) 0.0 - 2.0 mmol/L   Patient temperature HIDE    Sample type VENOUS    Comment NOTIFIED PHYSICIAN   I-STAT 3, arterial blood gas (G3+)     Status: Abnormal   Collection Time: 11/10/17  9:18 AM  Result Value Ref Range   pH, Arterial 7.441 7.350 - 7.450   pCO2 arterial 49.3 (H) 32.0 - 48.0 mmHg   pO2, Arterial 80.0 (L) 83.0 - 108.0 mmHg   Bicarbonate 33.6 (H) 20.0 - 28.0 mmol/L   TCO2 35 (H) 22 - 32 mmol/L   O2 Saturation 96.0 %   Acid-Base Excess 8.0 (H) 0.0 - 2.0 mmol/L   Patient temperature HIDE    Sample type ARTERIAL   POCT Activated clotting time     Status: None   Collection Time: 11/10/17  9:23 AM  Result Value Ref Range   Activated Clotting Time 180 seconds  Glucose, capillary     Status: None   Collection Time: 11/10/17 12:17 PM  Result Value Ref Range   Glucose-Capillary 91 65 - 99 mg/dL   Comment 1 Notify RN    Comment 2 Document in Chart   Glucose, CSF     Status: Abnormal   Collection Time: 11/10/17  2:50 PM  Result Value Ref Range   Glucose, CSF 77 (H) 40 - 70 mg/dL  Protein, CSF     Status: None   Collection Time: 11/10/17  2:50 PM  Result Value Ref Range   Total  Protein, CSF 32 15 - 45 mg/dL  CSF cell count with differential     Status: Abnormal   Collection Time: 11/10/17  2:50 PM  Result Value Ref Range   Tube # 1    Color, CSF STRAW (A) COLORLESS    Appearance, CSF CLEAR CLEAR  Supernatant COLORLESS    RBC Count, CSF 340 (H) 0 /cu mm   WBC, CSF 1 0 - 5 /cu mm   Other Cells, CSF TOO FEW TO COUNT, SMEAR AVAILABLE FOR REVIEW     Comment: Rare segs & lymphs.  CSF culture     Status: None (Preliminary result)   Collection Time: 11/10/17  3:55 PM  Result Value Ref Range   Specimen Description CSF    Special Requests NONE    Gram Stain      CYTOSPIN SMEAR WBC PRESENT, PREDOMINANTLY MONONUCLEAR NO ORGANISMS SEEN    Culture PENDING    Report Status PENDING   Glucose, capillary     Status: Abnormal   Collection Time: 11/10/17  4:15 PM  Result Value Ref Range   Glucose-Capillary 127 (H) 65 - 99 mg/dL   Comment 1 Notify RN    Comment 2 Document in Chart     Dg Fluoro Guide Lumbar Puncture  Result Date: 11/10/2017 CLINICAL DATA:  84 year old male with positive VDRL, confusion and abnormal gait. Diagnostic lumbar puncture requested. EXAM: DIAGNOSTIC LUMBAR PUNCTURE UNDER FLUOROSCOPIC GUIDANCE FLUOROSCOPY TIME:  Fluoroscopy Time:  0 min 48 sec Radiation Exposure Index (if provided by the fluoroscopic device): Number of Acquired Spot Images: 4 COMPARISON:  Lumbar radiographs 06/08/2014. PROCEDURE: Informed consent was obtained from the patient prior to the procedure, including potential complications of headache, infection, and bleeding. A "time-out" was performed. With the patient prone, the lower back was prepped with Betadine. 1% Lidocaine was used for local anesthesia. Lower lumbar spondylolisthesis noted on the 2015 comparison. Several fluoroscopic guided LP attempts were made at both the L2-L3, and L1-L2 levels with the needle encountering bone each time. Finally, using sub laminar technique the needle was able to be advanced through the lamina at L1-L2 level, and lumbar puncture was performed at that level using a 3.5 in x 20 gauge needle with return of initially bloody but ultimately clear, colorless CSF. 16 mL of CSF were obtained for  laboratory studies. The patient tolerated the procedure well. Minimal bleeding was encountered, direct pressure was held and hemostasis noted. There were no apparent complications. Appropriate post procedural orders were placed on the chart. The patient was returned to the inpatient floor in stable condition for continued treatment. IMPRESSION: Fluoroscopic guided lumbar puncture at L1-L2. CSF laboratory analysis pending. Electronically Signed   By: Genevie Ann M.D.   On: 11/10/2017 16:10    Review of Systems  Constitutional: Positive for malaise/fatigue. Negative for chills, fever and weight loss.       Poor appetite  Respiratory: Positive for cough and shortness of breath.   Cardiovascular: Positive for orthopnea and leg swelling. Negative for chest pain and palpitations.  Gastrointestinal: Negative.   Genitourinary: Negative.   Neurological: Negative for dizziness.       Difficulty with balance  Psychiatric/Behavioral: Negative.    Blood pressure 107/66, pulse 92, temperature 98 F (36.7 C), temperature source Axillary, resp. rate (!) 30, height 5' 6"  (1.676 m), weight 64.6 kg (142 lb 6.7 oz), SpO2 98 %. Physical Exam  Constitutional: He is oriented to person, place, and time.  Elderly, chronically ill-appearing gentleman in no distress  HENT:  Head: Normocephalic and atraumatic.  Eyes: EOM are normal. Pupils are equal, round, and reactive to light.  Neck: Normal range of motion. Neck supple. JVD present. No thyromegaly present.  Cardiovascular: Normal rate, regular rhythm and normal heart sounds.  No murmur heard. Respiratory: Effort normal. He has rales.  GI: Soft. Bowel sounds are normal. He exhibits no distension and no mass. There is no tenderness.  Musculoskeletal: Normal range of motion.  Minimal edema in lower legs.  Lymphadenopathy:    He has no cervical adenopathy.  Neurological: He is alert and oriented to person, place, and time.  Skin: Skin is warm and dry.  Psychiatric:  He has a normal mood and affect.       *[facility]*                         [place],  [address]                            [phone]  ------------------------------------------------------------------- Transthoracic Echocardiography  Patient:    Dionicio, Shelnutt MR #:       [id number] Study Date: 11/07/2017 Gender:     M Age:        74 Height:     167.6 cm Weight:     66.5 kg BSA:        1.77 m^2 Pt. Status: Room:       [address]    Ivor [place] [id number]  [place] [id number]  SONOGRAPHER  Johny Chess, RDCS, CCT  ATTENDING    Dhungel, Nishant [phone]  PERFORMING   [facility], Cv  cc:  ------------------------------------------------------------------- LV EF: 15% -   20%  ------------------------------------------------------------------- Indications:      CHF - 428.0.  ------------------------------------------------------------------- History:   PMH:   Coronary artery disease.  Risk factors:  Chronic kidney disease. Current tobacco use. Hypertension.  ------------------------------------------------------------------- Study Conclusions  - Left ventricle: The cavity size was mildly dilated. The estimated   ejection fraction was in the range of 15% to 20%. Severe diffuse   global hypokinesis. Grade II diastolic dysfunction. Elevated LAP. - Aortic valve: There was moderate regurgitation. - Mitral valve: Mildly dilated annulus. There was mild to moderate   regurgitation. - Left atrium: The atrium was moderately dilated. - Right ventricle: The cavity size was mildly dilated. Systolic   function was moderately reduced. - Right atrium: The atrium was mildly dilated. - Tricuspid valve: Mildly dilated annulus. There was   moderate-severe regurgitation. - Pulmonary arteries: PA peak pressure: 46 mm Hg (S). May be   underestimated due to elevated RA  pressure - Pericardium, extracardiac: There was a left pleural effusion.  Impressions:  - Severe biventricular failure.  ------------------------------------------------------------------- Study data:  No prior study was available for comparison.  Study status:  Routine.  Procedure:  The patient reported no pain pre or post test. Transthoracic echocardiography. Image quality was good. Study completion:  There were no complications. Transthoracic echocardiography.  M-mode, complete 2D, spectral Doppler, and color Doppler.  Birthdate:  Patient birthdate: 1932-11-14.  Age:  Patient is 84 yr old.  Sex:  Gender: male. BMI: 23.7 kg/m^2.  Blood pressure:     104/67  Patient status: Inpatient.  Study date:  Study date: 11/07/2017. Study time: 04:32 PM.  Location:  Bedside.  -------------------------------------------------------------------  ------------------------------------------------------------------- Left ventricle:  The cavity size was mildly dilated. The estimated ejection fraction was in the range  of 15% to 20%. Severe diffuse global hypokinesis. Grade II diastolic dysfunction. Elevated LAP.   ------------------------------------------------------------------- Aortic valve:   Trileaflet; mildly thickened leaflets.  Doppler: There was moderate regurgitation.  ------------------------------------------------------------------- Mitral valve:   Mildly dilated annulus.  Doppler:  There was mild to moderate regurgitation.    Peak gradient (D): 3 mm Hg.  ------------------------------------------------------------------- Left atrium:  The atrium was moderately dilated.  ------------------------------------------------------------------- Right ventricle:  The cavity size was mildly dilated. Systolic function was moderately reduced.  ------------------------------------------------------------------- Pulmonic valve:    The valve appears to be grossly normal. Doppler:   There was mild regurgitation.  ------------------------------------------------------------------- Tricuspid valve:   Mildly dilated annulus.  Doppler:  There was moderate-severe regurgitation.  ------------------------------------------------------------------- Right atrium:  The atrium was mildly dilated.  ------------------------------------------------------------------- Pericardium:  Trace pericardial effusion.  ------------------------------------------------------------------- Pleura:  There was a left pleural effusion.  ------------------------------------------------------------------- Measurements   Left ventricle                           Value        Reference  LV ID, ED, PLAX chordal          (H)     59    mm     43 - 52  LV ID, ES, PLAX chordal          (H)     51.4  mm     23 - 38  LV fx shortening, PLAX chordal   (L)     13    %      >=29  LV PW thickness, ED                      10.2  mm     ----------  IVS/LV PW ratio, ED                      0.86         <=1.3  Stroke volume, 2D                        65    ml     ----------  Stroke volume/bsa, 2D                    37    ml/m^2 ----------  LV ejection fraction, 1-p A4C            16    %      ----------  LV e&', lateral                           4.39  cm/s   ----------  LV E/e&', lateral                         18.79        ----------  LV e&', medial                            3.09  cm/s   ----------  LV E/e&', medial                          26.7         ----------  LV e&', average  3.74  cm/s   ----------  LV E/e&', average                         22.06        ----------    Ventricular septum                       Value        Reference  IVS thickness, ED                        8.77  mm     ----------    LVOT                                     Value        Reference  LVOT ID, S                               22    mm     ----------  LVOT area                                 3.8   cm^2   ----------  LVOT peak velocity, S                    77.6  cm/s   ----------  LVOT mean velocity, S                    49.2  cm/s   ----------  LVOT VTI, S                              17    cm     ----------    Aortic valve                             Value        Reference  Aortic regurg pressure half-time         431   ms     ----------    Aorta                                    Value        Reference  Aortic root ID, ED                       32    mm     ----------    Left atrium                              Value        Reference  LA ID, A-P, ES                           41    mm     ----------  LA ID/bsa, A-P                   (  H)     2.32  cm/m^2 <=2.2  LA volume, ES, 1-p A2C                   79.3  ml     ----------  LA volume/bsa, ES, 1-p A2C               44.9  ml/m^2 ----------    Mitral valve                             Value        Reference  Mitral E-wave peak velocity              82.5  cm/s   ----------  Mitral A-wave peak velocity              116   cm/s   ----------  Mitral deceleration time                 169   ms     150 - 230  Mitral peak gradient, D                  3     mm Hg  ----------  Mitral E/A ratio, peak                   0.7          ----------  Mitral regurg VTI, PISA                  137   cm     ----------    Pulmonary arteries                       Value        Reference  PA pressure, S, DP               (H)     41    mm Hg  <=30    Tricuspid valve                          Value        Reference  Tricuspid regurg peak velocity           277   cm/s   ----------  Tricuspid peak RV-RA gradient            31    mm Hg  ----------    Right atrium                             Value        Reference  RA ID, S-I, ES, A4C                      46.4  mm     34 - 49  RA area, ES, A4C                         16.2  cm^2   8.3 - 19.5  RA volume, ES, A/L                       48.3  ml     ----------  RA volume/bsa, ES, A/L  27.3   ml/m^2 ----------    Systemic veins                           Value        Reference  Estimated CVP                            10    mm Hg  ----------    Right ventricle                          Value        Reference  TAPSE                                    11.1  mm     ----------  RV pressure, S, DP               (H)     41    mm Hg  <=30  RV s&', lateral, S                        5.8   cm/s   ----------  Legend: (L)  and  (H)  mark values outside specified reference range.  ------------------------------------------------------------------- Prepared and Electronically Authenticated by  Vernell Leep, MD 2019-01-14T22:43:12    Physicians   Panel Physicians Referring Physician Case Authorizing Physician  Patwardhan, Reynold Bowen, MD (Primary)    Procedures   RIGHT/LEFT HEART CATH AND CORONARY ANGIOGRAPHY  Conclusion   Severe multivessel coronary artery disease LM: Normal LAD/Diag bifurcation 90% stenosis (1,1,0) Medium sized ramus mid 90% stenosis LCx prox 40% Grade 1 to 2 left-to-right collaterals Prox RCA 100%  Elevated LVEDP Severe biventricular failure  Recommendation: While CABG is indicated given his multivessel CAD and LVEF 20%, I am not sure if he is a good surgical candidate due to his comorbidities, particularly his poor baseline function status and tobacco abuse. There is a question of syphilis as etiology for his encephalopathy for which LP is recommended. Percutaneous intervention, if performed, should only be after such LP, as his bleeding risk will be high on P2Y12 inhibitor therapy. I recommend heart team discussion with CT surgery. Will await infectious disease and hospitalist recommendation regardign LP.   Nigel Mormon, MD [facility] Pager: [phone] Office: [phone] If no answer Cell [phone]    Procedural Details/Technique   Technical Details Procedures: 1. Ultrasound guided right antecubital and  right femoral vein access 2. Right radial artery access 3. Right heart catheterization 4. Left heart catheterization 5. Selective right and left coronary angiography 5. Conscious sedation monitoring 84 min  Indication: Heart failure with reduced ejection fraction  History: 84 year old African-American male with hypertension, CKD stage III, tobacco abuse, coronary artery disease status post late presentation MI of occluded right PDA medically managed in 2012, admitted with worsening shortness of breath as well as confusion. Workup showed severe biventricular failure by echocardiogram. Patient also had paroxysmal atrial flutter and fibrillation. I recommended right and left heart catheterization with coronary angiogram to establish etiology of his cardio myopathy.   Of note , there is ongoing workup for possible syphilis as a cause of encephalopathy given his positive RPR and T. pallidum antibody.     Catheter/s advanced over guidewire under fluoroscopy  6 Fr balloon tipped wedge catheter 5 Fr JL 4 5 Fr JR 4  Wires used: Inquire 0.025 glidewire   Pressures tracings obtained in right atrium, right ventricle, pulmonary artery, and pulmonary capillary wedge position.  Anticoagulation:  3000 units heparin  Total contrast used: 50 cc   Total fluoro time: 15.8 min Air Kerma: 229 mGy  All wires and catheters removed out of the body at the end of the procedure Final angiogram showed no dissection/perforation         Estimated blood loss <50 mL.  During this procedure the patient was administered the following to achieve and maintain moderate conscious sedation: Versed 1 mg, Fentanyl 50 mcg, while the patient's heart rate, blood pressure, and oxygen saturation were continuously monitored. The period of conscious sedation was 84 minutes, of which I was present face-to-face 100% of this time.  Complications   Complications documented before study signed (11/10/2017 12:29 PM  EST)    RIGHT/LEFT HEART CATH AND CORONARY ANGIOGRAPHY   None Documented by Nigel Mormon, MD 11/10/2017 9:31 AM EST  Time Range: Intra-procedure      Coronary Findings   Diagnostic  Dominance: Right  Left Main  Vessel is normal in caliber. Vessel is angiographically normal.  Left Anterior Descending  Vessel is normal in caliber.  Prox LAD lesion 90% stenosed with side branch in Ost 1st Diag 40% stenosed  Prox LAD lesion is 90% stenosed with 40% stenosed side branch in Ost 1st Diag.  Ramus Intermedius  Ramus lesion 90% stenosed  Ramus lesion is 90% stenosed.  Left Circumflex  Ost Cx to Prox Cx lesion 40% stenosed  Ost Cx to Prox Cx lesion is 40% stenosed.  Right Coronary Artery  Collaterals  Dist RCA filled by collaterals from 3rd Mrg.    Collaterals  Dist RCA filled by collaterals from 2nd Mrg.    Prox RCA lesion 100% stenosed  Prox RCA lesion is 100% stenosed.  Right Posterior Atrioventricular Branch  Collaterals  Post Atrio filled by collaterals from Dist LAD.    Intervention   No interventions have been documented.  Right Heart   Right Heart Pressures Elevated LV EDP consistent with volume overload. LVEDP 32 mmHg  Right Atrium Right atrial pressure is elevated. RA pressure 15 mmHg RV pressure 43/10, RVEDP 18 mmHg Unable to navigate right heart catheter into main pulmonary artery RV sats used as surrogate for PA sat CO 3.4 L/min, CI 1.7 L/m2  Coronary Diagrams   Diagnostic Diagram       Implants     No implant documentation for this case.  MERGE Images   Show images for CARDIAC CATHETERIZATION   Link to Procedure Log   Procedure Log    Hemo Data    Most Recent Value  Fick Cardiac Output 3.05 L/min  Fick Cardiac Output Index 1.76 (L/min)/BSA  RA A Wave 18 mmHg  RA V Wave 15 mmHg  RA Mean 15 mmHg  RV Systolic Pressure 43 mmHg  RV Diastolic Pressure 10 mmHg  RV EDP 18 mmHg  AO Systolic Pressure 91 mmHg  AO Diastolic Pressure 53 mmHg   AO Mean 69 mmHg  LV Systolic Pressure 98 mmHg  LV Diastolic Pressure 17 mmHg  LV EDP 30 mmHg  Arterial Occlusion Pressure Extended Systolic Pressure 94 mmHg  Arterial Occlusion Pressure Extended Diastolic Pressure 57 mmHg  Arterial Occlusion Pressure Extended Mean Pressure 72 mmHg  Left Ventricular Apex Extended Systolic Pressure 90 mmHg  Left Ventricular Apex Extended Diastolic  Pressure 13 mmHg  Left Ventricular Apex Extended EDP Pressure 26 mmHg  QP/QS 1  TSVR Index 39.14 HRUI     Assessment/Plan:  This 84 year old gentleman has severe multivessel coronary disease with high-grade stenoses in the LAD and ramus branch with a chronically occluded right coronary artery with severe left ventricular systolic dysfunction and biventricular heart failure.  He presented with acute on chronic systolic and diastolic heart failure with a BNP of 2000 and ejection fraction of 15-20%.  He has not had any chest pain or pressure.  He has stage III chronic kidney disease with an estimated GFR of 38.  I think his operative risk for coronary bypass graft surgery would be prohibitive given his age, severe left ventricular systolic dysfunction with biventricular heart failure, malnutrition, and recent problems with balance and falls.  He is LAD and ramus stenoses appear amenable to PCI.  That may not improve his left ventricular systolic function but may help prevent further deterioration.  He will require ongoing medical treatment of his biventricular heart failure.  I discussed all this with him and answered his questions.  I spent 40 minutes performing this consultation and > 50% of this time was spent face to face counseling and coordinating the care of this patient's severe multivessel CAD with severe LV systolic dysfunction and biventricular heart failure.  Gaye Pollack 11/10/2017, 7:06 PM

## 2017-11-10 NOTE — Progress Notes (Signed)
Advised by pharmacy to start Hep gtt back around 7pm & to contact pharm again to confirm. Held due to cath & lumbar puncture today.   [place]  Waniya Hoglund, RN

## 2017-11-10 NOTE — Progress Notes (Signed)
Subjective:  No chest pain Breathing stable Underwent cath today  Diuresing well Paroxysmal Afib/flutter  Cr 1.42  Objective:  Vital Signs in the last 24 hours: Temp:  [96.5 F (35.8 C)-98.4 F (36.9 C)] 96.5 F (35.8 C) (01/17 0958) Pulse Rate:  [0-86] 65 (01/17 0958) Resp:  [0-34] 18 (01/17 0958) BP: (94-118)/(59-84) 106/84 (01/17 0958) SpO2:  [0 %-100 %] 100 % (01/17 0958) Weight:  [64.6 kg (142 lb 6.7 oz)] 64.6 kg (142 lb 6.7 oz) (01/17 0612)  Intake/Output from previous day: 01/16 0701 - 01/17 0700 In: 936.6 [P.O.:236; I.V.:700.6] Out: 750 [Urine:750] Intake/Output from this shift: Total I/O In: -  Out: 575 [Urine:575]   Net -2.5 L  Physical Exam: Nursing note and vitals reviewed. Constitutional: He is oriented to person, place, and time. He appears well-developed and well-nourished. No distress.  HENT:  Head: Normocephalic and atraumatic.  Eyes: Pupils are equal, round, and reactive to light.  Neck: Normal range of motion. Neck supple. JVD present.  Cardiovascular: Normal rate and regular rhythm. Exam reveals no gallop.  II/VI holosystolic murmur RLSB Absent distal pulses. Chronic ischemic changes. No ulcers, no critical limb ischemia Respiratory: Effort normal. He has no wheezes. He has no rales. He exhibits no tenderness.  GI: Soft. Bowel sounds are normal. He exhibits distension. There is no tenderness.  Musculoskeletal: Normal range of motion. He exhibits no edema.  Neurological: He is alert and oriented to person, place, and time.  Skin: Skin is warm and dry.  Superficial abrasions bilateral shins Psychiatric: He has a normal mood and affect.     Lab Results: Recent Labs    11/09/17 0257 11/10/17 0305  WBC 8.8 7.7  HGB 12.7* 12.9*  PLT 130* 139*   Recent Labs    11/09/17 0257 11/09/17 1813 11/10/17 0305  NA 140  --  138  K 3.8  --  3.6  CL 97*  --  97*  CO2 32  --  31  GLUCOSE 116*  --  115*  BUN 37*  --  30*  CREATININE 1.81* 1.62*  1.42*   No results for input(s): TROPONINI in the last 72 hours.  Invalid input(s): CK, MB Hepatic Function Panel Recent Labs    11/09/17 0257  PROT 4.8*  ALBUMIN 2.8*  AST 53*  ALT 41  ALKPHOS 101  BILITOT 0.7    Imaging: CXR 11/07/2017: CLINICAL DATA:  Shortness of breath, history CHF, hypertension, MI, chronic kidney disease  EXAM: PORTABLE CHEST 1 VIEW  COMPARISON:  Portable exam 1149 hours compared to 11/06/2017  FINDINGS: Enlargement of cardiac silhouette.  Atherosclerotic calcification aorta.  Slight pulmonary vascular congestion.  Scarring in RIGHT upper lobe with mild bibasilar atelectasis.  No definite acute infiltrate, pleural effusion or pneumothorax.  Bones demineralized.  IMPRESSION: Enlargement of cardiac silhouette with pulmonary vascular congestion.  Mild bibasilar atelectasis.   Cardiac Studies: Study Conclusions  - Left ventricle: The cavity size was mildly dilated. The estimated   ejection fraction was in the range of 15% to 20%. Severe diffuse   global hypokinesis. Grade II diastolic dysfunction. Elevated LAP. - Aortic valve: There was moderate regurgitation. - Mitral valve: Mildly dilated annulus. There was mild to moderate   regurgitation. - Left atrium: The atrium was moderately dilated. - Right ventricle: The cavity size was mildly dilated. Systolic   function was moderately reduced. - Right atrium: The atrium was mildly dilated. - Tricuspid valve: Mildly dilated annulus. There was   moderate-severe regurgitation. - Pulmonary arteries: PA  peak pressure: 46 mm Hg (S). May be   underestimated due to elevated RA pressure - Pericardium, extracardiac: There was a left pleural effusion.  Impressions:  - Severe biventricular failure.  RHC/LHC 11/10/2017: Severe multivessel coronary artery disease LM: Normal LAD/Diag bifurcation 90% stenosis (1,1,0) Medium sized ramus mid 90% stenosis LCx prox 40% Grade 1 to 2  left-to-right collaterals Prox RCA 100%  Elevated LVEDP Severe biventricular failure  CO 3.4, CI 1.7 (Based on RV sats)   Assessment:  84 year old African-American male   Acute on chronic systolic and diastolic heart failure: Biventricular failure EF 15-20% Ischemic cardiomyopathy. Severe multivessel CAD Paroxismal atrial flutter CHA2DS2VASc score 4  4.8% annual stroke risk Troponin elevation: Likely type II MI in the setting of heart failure Likely new baseline Cr with CKD, cardiorenal syndrome Encephalopathy, now resolved. Etiology could be hypoglycemia vs syphilis. Positive RPR and T.pallidum antibody. Awaiting lumbar puncture today Tobacco abuse Possible PAD  Recommendations: While CABG is indicated given his multivessel CAD and LVEF 20%, I am not sure if he is a good surgical candidate due to his comorbidities, particularly his poor baseline function status and tobacco abuse. There is a question of syphilis as etiology for his encephalopathy for which LP is recommended. Percutaneous intervention, if performed, should only be after such LP, as his bleeding risk will be high on P2Y12 inhibitor therapy. I recommend heart team discussion with CT surgery. Will await infectious disease and hospitalist recommendation regarding LP. Continue low dose beta blocker, if tolerated by blood pressure. Continue low dose bidil. Continue low dose bidil 20-37.5 1/2 tab tid for afterload reduction. Unable to use ACEiARB/ARNI at this time given acute kidney injury.  Switched metoprolol tartarate to succinate 25 mg daily. Resume IV lasix 40 mg bid later today. Continue heparin for anticoagulation. Recommend switching to renally dosed xarelto prior to discharge. Continue aspirin, statin.    Heart failure diet. Maintain K around 4, Mg around 2.    LOS: 4 days    Rashana Andrew J Jguadalupe Opiela 11/10/2017, 11:45 AM  Roselene Gray Emiliano DyerJ Silver Parkey, MD [facility] [place] Pager: [phone] Office:  [phone] If no answer Cell [phone]

## 2017-11-10 NOTE — Interval H&P Note (Signed)
History and Physical Interval Note:  11/10/2017 7:34 AM  [name] [name]  has presented today for surgery, with the diagnosis of unstable angina  The various methods of treatment have been discussed with the patient and family. After consideration of risks, benefits and other options for treatment, the patient has consented to  Procedure(s): RIGHT/LEFT HEART CATH AND CORONARY ANGIOGRAPHY (N/A) as a surgical intervention .  The patient's history has been reviewed, patient examined, no change in status, stable for surgery.  I have reviewed the patient's chart and labs.  Questions were answered to the patient's satisfaction.     2012 Appropriate Use Criteria for Diagnostic Catheterization Home / Select Test of Interest Indication for RHC Cardiomyopathies Cardiomyopathies (Right and Left Heart Catheterization OR Right Heart Catheterization Alone With/Wit  Cardiomyopathies  (Right and Left Heart Catheterization OR  Right Heart Catheterization Alone With/Without Left Ventriculography and Coronary Angiography)  Link Here: [email or web address] Indication:  1. Known or suspected cardiomyopathy with or without heart failure A (7) Indication: 93; Score 7    Craige Patel J Tylena Prisk

## 2017-11-10 NOTE — Care Management Note (Addendum)
Case Management Note Previous Note created by Henderson NewcomerKelli Williard  Patient Details  Name: [name] LernerBobby Towner MRN: [id number] Date of Birth: 02/02/1933  Subjective/Objective:       Pt in with acute CHF. He is from home alone.              Action/Plan: Awaiting PT/OT recommendations. CM following for d/c needs, physician orders.   Expected Discharge Date:                  Expected Discharge Plan:  Skilled Nursing Facility  In-House Referral:  Clinical Social Work  Discharge planning Services  CM Consult  Post Acute Care Choice:    Choice offered to:     DME Arranged:    DME Agency:     HH Arranged:    HH Agency:     Status of Service:  In process, will continue to follow  If discussed at Long Length of Stay Meetings, dates discussed:    Additional Comments: 11/11/2017 Pt is now requiring Dopamine and Milrinone and IV heparin.  Per cardiothoracic pt is not candidate for CABG.  Pt has latent syphillis - LP not yet resolved  11/10/17 Pt is now on SD.  Pt is recommended for SNF - CSW consulted.  Pt will be started on inotrope's and IV heparin while under consideration for CABG.  Pt also scheduled to undergo LP to determine etiology on encephalopathy  Cherylann ParrClaxton, Nashla Althoff S, RN 11/10/2017, 3:08 PM

## 2017-11-10 NOTE — Progress Notes (Signed)
ANTICOAGULATION CONSULT NOTE - Follow Up Consult MEDICATION-RELATED CONSULT NOTE - Initial Consult  Pharmacy Consult for Heparin and Milrinone Indication: atrial fibrillation and acute inotropic support  No Known Allergies  Patient Measurements: Height: 5\' 6"  (167.6 cm) Weight: 142 lb 6.7 oz (64.6 kg) IBW/kg (Calculated) : 63.8  Vital Signs: Temp: 97.3 F (36.3 C) (01/17 1213) Temp Source: Oral (01/17 1213) BP: 110/76 (01/17 1350) Pulse Rate: 130 (01/17 1350)  Labs: Recent Labs    11/08/17 0253 11/09/17 0257 11/09/17 1813 11/10/17 0303 11/10/17 0305  HGB 13.6 12.7*  --   --  12.9*  HCT 42.2 39.1  --   --  39.9  PLT 130* 130*  --   --  139*  HEPARINUNFRC 0.55 0.49  --  0.34  --   CREATININE 1.70* 1.81* 1.62*  --  1.42*    Estimated Creatinine Clearance: 36.2 mL/min (A) (by C-G formula based on SCr of 1.42 mg/dL (H)).  Assessment: 84yom started on heparin for new afib. Had cath today which showed severe multivessel disease - CVTS consulted and awaiting decision CABG vs PCI. Also planning for LP this afternoon to rule out cause of encephalopathy. Spoke to Dr. Ronalee BeltsBhandari and heparin to resume 4 hours post LP.  Also found to have severe biventricular failure with EF 20%. Pharmacy asked to start milrinone and he will also begin dopamine. AKI improving. SBP 110s.  Goal of Therapy:  Heparin level 0.3-0.7 units/ml Monitor platelets by anticoagulation protocol: Yes   Plan:  1) At 1900 tonight, restart heparin at 1900 units/hr 2) Check 8 hour heparin level 3) Start milrinone 0.25 mcg/kg/min 4) Consider checking co-ox to better assess cardiac output  Fredrik RiggerMarkle, Plez Belton Sue 11/10/2017,1:58 PM

## 2017-11-10 NOTE — Progress Notes (Signed)
PROGRESS NOTE    [name] [name]  ZOX:[id number] DOB: [birth year] DOA: 11/06/2017 PCP: Burtis Junes, MD (Inactive)   Brief Narrative:  84 year old male with hypertension, hypothyroidism, CAD with history of MI s/p PCI with stent, CKD3, tobacco abuse presented with dyspnea of 2 months duration associated with cough, confusion, and a fall. Seen by his PCP a week  prior to that admission and diagnosed with new onset CHF. Started on Lasix and referred to cardiologist. At presentation to the ER,pt was hypoxic with O2 sat of 84% RA, pulmonary vascular congestion on CXR. Admitted for acute on chronic CHF. 11/07/17: 2D echo revealed LVEF 15-20% complicated by Aflutter. S/p cardiac cath on 11/10/2017  Assessment & Plan:   #Acute respiratory failure with hypoxia in the setting of CHF -Resume Lasix.  Currently on about 2 L of oxygen.  Continue supportive care.  #Acute on chronic combined systolic and diastolic congestive heart failure, EF of 15-20% -Status post cardiac cath with severe multivessel coronary artery disease.  Discussed with the cardiologist today.  Cardiologist is Editor, commissioning further evaluation. -Resume Lasix, continue BiDil, metoprolol succinate.  Monitor blood pressure closely. -Unable to use ACE or R because of renal failure -Currently on IV heparin.  Likely switching to oral Xarelto before discharge.  Now waiting for surgeons evaluated.  #Paroxysmal atrial flutter: On IV heparin.  On metoprolol.  Monitor heart rate.  #Elevated troponin level in the setting of CHF: Status post cath.  #Chronic kidney disease stage III: Serum creatinine level stable..  Monitor BMP.  #Suspected dysphagia: Evaluate by speech and swallow.  Aspiration precaution  #Acute metabolic encephalopathy due to hypoglycemia: Resolved now.  #Hypoglycemia: Blood sugar level improving.  Encourage oral intake.  Currently n.p.o. for possible cath.  Currently on dextrose to D10.    #Transaminitis:  Hepatitis C virus antibody positive.  Recommend outpatient follow-up.  #Tobacco abuse: Nicotine patch patient was counseled.  #History of hypertension: Blood pressure was low this morning.  Currently on cardiac medication.  Continue to monitor blood pressure closely.  #Frequent falls, physical deconditioning: CT MRI no acute finding.  PT/OT recommended a skilled nursing home.  Social worker consult.  #RPR and T. pallidum antibody positive: Discussed with Dr. Luciana Axe from infectious disease yesterday and today.  Undergoing lumbar puncture for VDRL test today.  I discussed with the radiologist today for further procedure.  Follow-up test result.  May need treatment if it is positive.  I have discussed this with the patient and he agreed with the plan.   DVT prophylaxis: IV heparin Code Status: Full code Family Communication: No family at bedside Disposition Plan: Stepdown    Consultants:   Cardiology  Procedures: None Antimicrobials: None  Subjective: Seen and examined at bedside.  Status post cardiac cath.  Reported hungry.  Denied nausea vomiting chest pain shortness of breath. Objective: Vitals:   11/10/17 0937 11/10/17 0942 11/10/17 0946 11/10/17 0958  BP: 113/73 116/76  106/84  Pulse: 65 66 (!) 0 65  Resp: (!) 8 (!) 0 (!) 4 18  Temp:    (!) 96.5 F (35.8 C)  TempSrc:    Axillary  SpO2: 100% 98% (!) 0% 100%  Weight:      Height:        Intake/Output Summary (Last 24 hours) at 11/10/2017 1159 Last data filed at 11/10/2017 1113 Gross per 24 hour  Intake 815.63 ml  Output 1325 ml  Net -509.37 ml   Filed Weights   11/08/17 0444 11/09/17 0500 11/10/17 8119  Weight: 63.7 kg (140 lb 6.9 oz) 63.5 kg (139 lb 15.9 oz) 64.6 kg (142 lb 6.7 oz)    Examination:  General exam: Lying in bed comfortable. Respiratory system: Clear bilateral, respiratory for normal. Cardiovascular system: Regular rate rhythm S1-S2 normal.  No pedal edema.  Gastrointestinal system: Abdomen is  nondistended, soft and nontender. Normal bowel sounds heard. Central nervous system: Alert and oriented. No focal neurological deficits. Extremities: Symmetric 5 x 5 power. Skin: No rashes, lesions or ulcers Psychiatry: Judgement and insight appear normal. Mood & affect appropriate.     Data Reviewed: I have personally reviewed following labs and imaging studies  CBC: Recent Labs  Lab 11/06/17 1542 11/08/17 0253 11/09/17 0257 11/10/17 0305  WBC 11.1* 8.0 8.8 7.7  HGB 15.6 13.6 12.7* 12.9*  HCT 47.1 42.2 39.1 39.9  MCV 96.5 98.4 99.0 96.6  PLT 136* 130* 130* 139*   Basic Metabolic Panel: Recent Labs  Lab 11/07/17 0731 11/07/17 0843 11/07/17 1843 11/08/17 0253 11/09/17 0257 11/09/17 1813 11/10/17 0305  NA 145  --  143 141 140  --  138  K 4.2  --  4.6 4.1 3.8  --  3.6  CL 108  --  105 101 97*  --  97*  CO2 25  --  24 29 32  --  31  GLUCOSE 121*  --  99 74 116*  --  115*  BUN 39*  --  41* 39* 37*  --  30*  CREATININE 1.58*  --  1.79* 1.70* 1.81* 1.62* 1.42*  CALCIUM 8.5*  --  8.3* 8.2* 7.9*  --  7.8*  MG  --  2.0  --   --  1.6*  --  2.0   GFR: Estimated Creatinine Clearance: 36.2 mL/min (A) (by C-G formula based on SCr of 1.42 mg/dL (H)). Liver Function Tests: Recent Labs  Lab 11/06/17 1651 11/09/17 0257  AST 78* 53*  ALT 59 41  ALKPHOS 110 101  BILITOT 1.1 0.7  PROT 6.0* 4.8*  ALBUMIN 3.6 2.8*   No results for input(s): LIPASE, AMYLASE in the last 168 hours. Recent Labs  Lab 11/06/17 1651  AMMONIA 22   Coagulation Profile: Recent Labs  Lab 11/06/17 1935  INR 1.21   Cardiac Enzymes: Recent Labs  Lab 11/06/17 1935 11/07/17 0152 11/07/17 0731  TROPONINI 0.29* 0.44* 0.37*   BNP (last 3 results) No results for input(s): PROBNP in the last 8760 hours. HbA1C: No results for input(s): HGBA1C in the last 72 hours. CBG: Recent Labs  Lab 11/09/17 1147 11/09/17 1651 11/09/17 1935 11/09/17 2308 11/10/17 0404  GLUCAP 125* 112* 105* 110* 96    Lipid Profile: No results for input(s): CHOL, HDL, LDLCALC, TRIG, CHOLHDL, LDLDIRECT in the last 72 hours. Thyroid Function Tests: No results for input(s): TSH, T4TOTAL, FREET4, T3FREE, THYROIDAB in the last 72 hours. Anemia Panel: No results for input(s): VITAMINB12, FOLATE, FERRITIN, TIBC, IRON, RETICCTPCT in the last 72 hours. Sepsis Labs: Recent Labs  Lab 11/06/17 1609 11/06/17 1935 11/06/17 2228  PROCALCITON  --  0.10  --   LATICACIDVEN 2.99* 2.5* 2.3*    Recent Results (from the past 240 hour(s))  Culture, blood (Routine X 2) w Reflex to ID Panel     Status: None (Preliminary result)   Collection Time: 11/06/17  7:40 PM  Result Value Ref Range Status   Specimen Description BLOOD RIGHT ANTECUBITAL  Final   Special Requests   Final    BOTTLES DRAWN AEROBIC AND ANAEROBIC Blood  Culture adequate volume   Culture NO GROWTH 4 DAYS  Final   Report Status PENDING  Incomplete  Culture, blood (Routine X 2) w Reflex to ID Panel     Status: None (Preliminary result)   Collection Time: 11/06/17  7:57 PM  Result Value Ref Range Status   Specimen Description BLOOD RIGHT HAND  Final   Special Requests IN PEDIATRIC BOTTLE Blood Culture adequate volume  Final   Culture NO GROWTH 4 DAYS  Final   Report Status PENDING  Incomplete  Urine Culture     Status: None   Collection Time: 11/07/17 12:55 AM  Result Value Ref Range Status   Specimen Description URINE, RANDOM  Final   Special Requests NONE  Final   Culture NO GROWTH  Final   Report Status 11/08/2017 FINAL  Final  MRSA PCR Screening     Status: None   Collection Time: 11/07/17  9:57 PM  Result Value Ref Range Status   MRSA by PCR NEGATIVE NEGATIVE Final    Comment:        The GeneXpert MRSA Assay (FDA approved for NASAL specimens only), is one component of a comprehensive MRSA colonization surveillance program. It is not intended to diagnose MRSA infection nor to guide or monitor treatment for MRSA infections.           Radiology Studies: No results found.      Scheduled Meds: . aspirin  324 mg Oral Daily  . atorvastatin  40 mg Oral q1800  . furosemide  40 mg Oral BID  . isosorbide-hydrALAZINE  0.5 tablet Oral TID  . lidocaine (PF)  5 mL Intradermal Once  . metoprolol succinate  25 mg Oral Daily  . mupirocin cream   Topical Daily  . nicotine  21 mg Transdermal Daily  . sodium chloride flush  3 mL Intravenous Q12H   Continuous Infusions: . sodium chloride    . dextrose 1 mL (11/09/17 1249)  . heparin Stopped (11/10/17 1044)     LOS: 4 days    Katie Faraone Jaynie Collins, MD [facility] Pager [phone]  If 7PM-7AM, please contact night-coverage [email or web address] Password [email or web address] 11/10/2017, 11:59 AM

## 2017-11-10 NOTE — Progress Notes (Addendum)
Subjective:  No chest pain Breathing stable Underwent cath today  Diuresing well Paroxysmal Afib/flutter  Cr 1.42  Objective:  Vital Signs in the last 24 hours: Temp:  [96.5 F (35.8 C)-98.4 F (36.9 C)] 96.5 F (35.8 C) (01/17 0958) Pulse Rate:  [0-86] 65 (01/17 0958) Resp:  [0-34] 18 (01/17 0958) BP: (94-118)/(59-84) 106/84 (01/17 0958) SpO2:  [0 %-100 %] 100 % (01/17 0958) Weight:  [64.6 kg (142 lb 6.7 oz)] 64.6 kg (142 lb 6.7 oz) (01/17 0612)  Intake/Output from previous day: 01/16 0701 - 01/17 0700 In: 936.6 [P.O.:236; I.V.:700.6] Out: 750 [Urine:750] Intake/Output from this shift: Total I/O In: -  Out: 575 [Urine:575]   Net -2.5 L  Physical Exam: Nursing note and vitals reviewed. Constitutional: He is oriented to person, place, and time. He appears well-developed and well-nourished. No distress.  HENT:  Head: Normocephalic and atraumatic.  Eyes: Pupils are equal, round, and reactive to light.  Neck: Normal range of motion. Neck supple. JVD present.  Cardiovascular: Normal rate and regular rhythm. Exam reveals no gallop.  II/VI holosystolic murmur RLSB Absent distal pulses. Chronic ischemic changes. No ulcers, no critical limb ischemia Respiratory: Effort normal. He has no wheezes. He has no rales. He exhibits no tenderness.  GI: Soft. Bowel sounds are normal. He exhibits distension. There is no tenderness.  Musculoskeletal: Normal range of motion. He exhibits no edema.  Neurological: He is alert and oriented to person, place, and time.  Skin: Skin is warm and dry.  Superficial abrasions bilateral shins Psychiatric: He has a normal mood and affect.     Lab Results: Recent Labs    11/09/17 0257 11/10/17 0305  WBC 8.8 7.7  HGB 12.7* 12.9*  PLT 130* 139*   Recent Labs    11/09/17 0257 11/09/17 1813 11/10/17 0305  NA 140  --  138  K 3.8  --  3.6  CL 97*  --  97*  CO2 32  --  31  GLUCOSE 116*  --  115*  BUN 37*  --  30*  CREATININE 1.81* 1.62*  1.42*   No results for input(s): TROPONINI in the last 72 hours.  Invalid input(s): CK, MB Hepatic Function Panel Recent Labs    11/09/17 0257  PROT 4.8*  ALBUMIN 2.8*  AST 53*  ALT 41  ALKPHOS 101  BILITOT 0.7    Imaging: CXR 11/07/2017: CLINICAL DATA:  Shortness of breath, history CHF, hypertension, MI, chronic kidney disease  EXAM: PORTABLE CHEST 1 VIEW  COMPARISON:  Portable exam 1149 hours compared to 11/06/2017  FINDINGS: Enlargement of cardiac silhouette.  Atherosclerotic calcification aorta.  Slight pulmonary vascular congestion.  Scarring in RIGHT upper lobe with mild bibasilar atelectasis.  No definite acute infiltrate, pleural effusion or pneumothorax.  Bones demineralized.  IMPRESSION: Enlargement of cardiac silhouette with pulmonary vascular congestion.  Mild bibasilar atelectasis.   Cardiac Studies: Study Conclusions  - Left ventricle: The cavity size was mildly dilated. The estimated   ejection fraction was in the range of 15% to 20%. Severe diffuse   global hypokinesis. Grade II diastolic dysfunction. Elevated LAP. - Aortic valve: There was moderate regurgitation. - Mitral valve: Mildly dilated annulus. There was mild to moderate   regurgitation. - Left atrium: The atrium was moderately dilated. - Right ventricle: The cavity size was mildly dilated. Systolic   function was moderately reduced. - Right atrium: The atrium was mildly dilated. - Tricuspid valve: Mildly dilated annulus. There was   moderate-severe regurgitation. - Pulmonary arteries: PA  peak pressure: 46 mm Hg (S). May be   underestimated due to elevated RA pressure - Pericardium, extracardiac: There was a left pleural effusion.  Impressions:  - Severe biventricular failure.  RHC/LHC 11/10/2017: Severe multivessel coronary artery disease LM: Normal LAD/Diag bifurcation 90% stenosis (1,1,0) Medium sized ramus mid 90% stenosis LCx prox 40% Grade 1 to 2  left-to-right collaterals Prox RCA 100%  Elevated LVEDP Severe biventricular failure  CO 3.4, CI 1.7 (Based on RV sats)   Assessment:  84 year old African-American male   Impending cardiogenic shock Acute on chronic systolic and diastolic heart failure: Biventricular failure EF 15-20% Ischemic cardiomyopathy. Severe multivessel CAD Paroxismal atrial flutter CHA2DS2VASc score 4  4.8% annual stroke risk Troponin elevation: Likely type II MI in the setting of heart failure AKI/CKD: Cardiorenal syndrome. AKI resolving Encephalopathy, now resolved. Etiology could be hypoglycemia vs syphilis. Positive RPR and T.pallidum antibody. Awaiting lumbar puncture today Tobacco abuse Possible PAD  Recommendations: While CABG is indicated given his multivessel CAD and LVEF 20%, I am not sure if he is a good surgical candidate due to his comorbidities, particularly his poor baseline function status and tobacco abuse. There is a question of syphilis as etiology for his encephalopathy for which LP is recommended. Percutaneous intervention, if performed, should only be after such LP, as his bleeding risk will be high on P2Y12 inhibitor therapy. I recommend heart team discussion with CT surgery. Will await infectious disease and hospitalist recommendation regarding LP. Will start on low dose dopamine and milrinone. Hold beta blocker. PO lasix today, with prn IV lasix doses.  Continue low dose bidil 20-37.5 1/2 tab tid for afterload reduction. Unable to use ACEiARB/ARNI at this time given acute kidney injury.  Continue heparin for anticoagulation. Recommend switching to renally dosed xarelto prior to discharge. Continue aspirin, statin.    Heart failure diet. Maintain K around 4, Mg around 2.    LOS: 4 days    Manish J Patwardhan 11/10/2017, 12:00 PM  Manish Emiliano Dyer, MD [facility] Pager: [phone] Office: [phone] If no answer Cell [phone]

## 2017-11-11 DIAGNOSIS — N182 Chronic kidney disease, stage 2 (mild): Secondary | ICD-10-CM

## 2017-11-11 DIAGNOSIS — I2510 Atherosclerotic heart disease of native coronary artery without angina pectoris: Secondary | ICD-10-CM

## 2017-11-11 DIAGNOSIS — E039 Hypothyroidism, unspecified: Secondary | ICD-10-CM

## 2017-11-11 DIAGNOSIS — A530 Latent syphilis, unspecified as early or late: Secondary | ICD-10-CM

## 2017-11-11 DIAGNOSIS — F17210 Nicotine dependence, cigarettes, uncomplicated: Secondary | ICD-10-CM

## 2017-11-11 DIAGNOSIS — R768 Other specified abnormal immunological findings in serum: Secondary | ICD-10-CM

## 2017-11-11 DIAGNOSIS — I130 Hypertensive heart and chronic kidney disease with heart failure and stage 1 through stage 4 chronic kidney disease, or unspecified chronic kidney disease: Principal | ICD-10-CM

## 2017-11-11 DIAGNOSIS — Z960 Presence of urogenital implants: Secondary | ICD-10-CM

## 2017-11-11 LAB — GLUCOSE, CAPILLARY
GLUCOSE-CAPILLARY: 103 mg/dL — AB (ref 65–99)
GLUCOSE-CAPILLARY: 123 mg/dL — AB (ref 65–99)
GLUCOSE-CAPILLARY: 143 mg/dL — AB (ref 65–99)
GLUCOSE-CAPILLARY: 95 mg/dL (ref 65–99)
Glucose-Capillary: 103 mg/dL — ABNORMAL HIGH (ref 65–99)
Glucose-Capillary: 104 mg/dL — ABNORMAL HIGH (ref 65–99)
Glucose-Capillary: 119 mg/dL — ABNORMAL HIGH (ref 65–99)

## 2017-11-11 LAB — CBC
HCT: 39.3 % (ref 39.0–52.0)
Hemoglobin: 13 g/dL (ref 13.0–17.0)
MCH: 32.2 pg (ref 26.0–34.0)
MCHC: 33.1 g/dL (ref 30.0–36.0)
MCV: 97.3 fL (ref 78.0–100.0)
Platelets: 152 10*3/uL (ref 150–400)
RBC: 4.04 MIL/uL — ABNORMAL LOW (ref 4.22–5.81)
RDW: 15.1 % (ref 11.5–15.5)
WBC: 8.6 10*3/uL (ref 4.0–10.5)

## 2017-11-11 LAB — CULTURE, BLOOD (ROUTINE X 2)
CULTURE: NO GROWTH
CULTURE: NO GROWTH
SPECIAL REQUESTS: ADEQUATE
Special Requests: ADEQUATE

## 2017-11-11 LAB — BASIC METABOLIC PANEL
Anion gap: 10 (ref 5–15)
BUN: 24 mg/dL — AB (ref 6–20)
CALCIUM: 8.1 mg/dL — AB (ref 8.9–10.3)
CO2: 34 mmol/L — ABNORMAL HIGH (ref 22–32)
CREATININE: 1.37 mg/dL — AB (ref 0.61–1.24)
Chloride: 97 mmol/L — ABNORMAL LOW (ref 101–111)
GFR, EST AFRICAN AMERICAN: 54 mL/min — AB (ref >60)
GFR, EST NON AFRICAN AMERICAN: 46 mL/min — AB (ref >60)
Glucose, Bld: 109 mg/dL — ABNORMAL HIGH (ref 65–99)
Potassium: 3.7 mmol/L (ref 3.5–5.1)
SODIUM: 141 mmol/L (ref 135–145)

## 2017-11-11 LAB — VDRL, CSF: SYPHILIS VDRL QUANT CSF: NONREACTIVE

## 2017-11-11 LAB — HEPARIN LEVEL (UNFRACTIONATED)
HEPARIN UNFRACTIONATED: 0.35 [IU]/mL (ref 0.30–0.70)
Heparin Unfractionated: 0.22 IU/mL — ABNORMAL LOW (ref 0.30–0.70)

## 2017-11-11 MED ORDER — PENICILLIN G BENZATHINE 1200000 UNIT/2ML IM SUSP
2.4000 10*6.[IU] | INTRAMUSCULAR | Status: DC
  Administered 2017-11-11: 2.4 10*6.[IU] via INTRAMUSCULAR
  Filled 2017-11-11: qty 4

## 2017-11-11 MED ORDER — POTASSIUM CHLORIDE CRYS ER 20 MEQ PO TBCR
40.0000 meq | EXTENDED_RELEASE_TABLET | Freq: Every day | ORAL | Status: DC
  Administered 2017-11-11 – 2017-11-15 (×5): 40 meq via ORAL
  Filled 2017-11-11 (×5): qty 2

## 2017-11-11 NOTE — Progress Notes (Signed)
Physical Therapy Treatment Patient Details Name: [name] [name] MRN: [id number] DOB: 1933/04/23 Today's Date: 11/11/2017    History of Present Illness Pt is a 84 yo male admitted 10/27/17 with SOB, confusion and poor balance. Pt found to have a new CHF exacerbation. 2D echo on 1/14 revealed LVEF 15-20% complicated by Aflutter. S/p left heart cath 1/17 showing severe multivessel CAD. S/p lumbar puncture 1/17. PMH includes CAD, HTN, MI, CKD and SOB.     PT Comments    Today's treatment session limited by HR up to 163 with ambulation (RN notified). Pt continues to demonstrate decreased safety awareness, requiring close min guard and intermittent minA for balance with mobility. Continue to recommend SNF-level therapies at d/c. Will follow acutely.    Follow Up Recommendations  SNF     Equipment Recommendations  Rolling walker with 5" wheels    Recommendations for Other Services       Precautions / Restrictions Precautions Precautions: Fall Precaution Comments: Watch HR Restrictions Weight Bearing Restrictions: No    Mobility  Bed Mobility Overal bed mobility: Needs Assistance Bed Mobility: Supine to Sit     Supine to sit: Supervision        Transfers Overall transfer level: Needs assistance Equipment used: Rolling walker (2 wheeled) Transfers: Sit to/from Stand Sit to Stand: Min assist         General transfer comment: Cues for safe technique and hand placement; minA to steady RW  Ambulation/Gait Ambulation/Gait assistance: Min guard Ambulation Distance (Feet): 50 Feet Assistive device: Rolling walker (2 wheeled) Gait Pattern/deviations: Step-through pattern;Decreased stride length;Trunk flexed Gait velocity: Decreased Gait velocity interpretation: <1.8 ft/sec, indicative of risk for recurrent falls General Gait Details: Slow, unstable amb with RW and close min guard for balance. HR up to 163 with short distance; pt asymptomatic. Returned to room and Scientist, water quality Rankin (Stroke Patients Only)       Balance Overall balance assessment: Needs assistance Sitting-balance support: Feet supported Sitting balance-Leahy Scale: Good     Standing balance support: Single extremity supported Standing balance-Leahy Scale: Poor Standing balance comment: min guard for static standing and min assist for any dynamic                            Cognition Arousal/Alertness: Awake/alert Behavior During Therapy: WFL for tasks assessed/performed Overall Cognitive Status: Impaired/Different from baseline Area of Impairment: Safety/judgement;Attention                   Current Attention Level: Selective     Safety/Judgement: Decreased awareness of safety;Decreased awareness of deficits            Exercises      General Comments        Pertinent Vitals/Pain Pain Assessment: No/denies pain    Home Living                      Prior Function            PT Goals (current goals can now be found in the care plan section) Acute Rehab PT Goals Patient Stated Goal: Get stronger at rehab before going home PT Goal Formulation: With patient Time For Goal Achievement: 11/22/17 Potential to Achieve Goals: Good Progress towards PT goals: Progressing toward goals    Frequency    Min 2X/week  PT Plan Current plan remains appropriate    Co-evaluation              AM-PAC PT "6 Clicks" Daily Activity  Outcome Measure  Difficulty turning over in bed (including adjusting bedclothes, sheets and blankets)?: None Difficulty moving from lying on back to sitting on the side of the bed? : A Little Difficulty sitting down on and standing up from a chair with arms (e.g., wheelchair, bedside commode, etc,.)?: Unable Help needed moving to and from a bed to chair (including a wheelchair)?: A Little Help needed walking in hospital room?: A Little Help  needed climbing 3-5 steps with a railing? : A Lot 6 Click Score: 16    End of Session Equipment Utilized During Treatment: Gait belt;Oxygen Activity Tolerance: Patient tolerated treatment well Patient left: in chair;with call bell/phone within reach;with chair alarm set Nurse Communication: Mobility status PT Visit Diagnosis: Unsteadiness on feet (R26.81);Muscle weakness (generalized) (M62.81)     Time: [id number] PT Time Calculation (min) (ACUTE ONLY): 21 min  Charges:  $Gait Training: 8-22 mins                    G Codes:      Ina HomesJaclyn Jolean Madariaga, PT, DPT Acute Rehab Services  Pager: [phone]  Malachy ChamberJaclyn L Icey Tello 11/11/2017, 4:18 PM

## 2017-11-11 NOTE — Progress Notes (Signed)
Chaplain was called to Mr Bagg's room for Advanced Directive Consult.  Chaplain engaged supporting family members and patient to explain document and begin process of executing. At the family's request Chaplain also offered scriptures and prayer. Mr. Harrold DonathCrump is well supported by an attentive family support system. Notaries were unavailable on Friday afternoon. I will see that the Advanced Directive is completed before the end of Mr. Culliver's stay. I spoke with the attending clinician and they communicated that the patient is likely to be in the hospital through Tuesday.

## 2017-11-11 NOTE — Progress Notes (Signed)
PROGRESS NOTE    [name] [name]  ZOX:[id number] DOB: 17-May-1933 DOA: 11/06/2017 PCP: Burtis Junes, MD (Inactive)   Brief Narrative:  84 year old male with hypertension, hypothyroidism, CAD with history of MI s/p PCI with stent, CKD3, tobacco abuse presented with dyspnea of 2 months duration associated with cough, confusion, and a fall. Seen by his PCP a week  prior to that admission and diagnosed with new onset CHF. Started on Lasix and referred to cardiologist. At presentation to the ER,pt was hypoxic with O2 sat of 84% RA, pulmonary vascular congestion on CXR. Admitted for acute on chronic CHF. 11/07/17: 2D echo revealed LVEF 15-20% complicated by Aflutter. S/p cardiac cath on 11/10/2017  Assessment & Plan:   #Acute respiratory failure with hypoxia in the setting of CHF - Currently on about 2 L of oxygen.  Continue supportive care.  #Acute on chronic combined systolic and diastolic congestive heart failure, EF of 15-20%/cardiogenic shock -Status post cardiac cath with severe multivessel coronary artery disease.  Evaluated by cardiothoracic surgeon, not a candidate for surgical procedure. -Started on dopamine and milrinone by cardiologist. -Continue Lasix,  BiDil, metoprolol succinate.  Monitor blood pressure closely. -Unable to use ACE or R because of renal failure -Currently on IV heparin.  Likely switching to oral Xarelto before discharge.  -Follow-up with cardiologist for possible need of a stent placement.  #Paroxysmal atrial flutter: On IV heparin.  On metoprolol.  Monitor heart rate.  #Elevated troponin level in the setting of CHF: Status post cath.  #Chronic kidney disease stage III: Serum creatinine level stable..  Monitor BMP.  #Suspected dysphagia: Evaluate by speech and swallow.  Aspiration precaution tolerating diet well.  #Acute metabolic encephalopathy due to hypoglycemia: Resolved now.  #Hypoglycemia: Blood sugar level improving.  Encourage oral intake.   Currently n.p.o. for possible cath.  Currently on dextrose to D10.    #Transaminitis: Hepatitis C virus antibody positive.  Recommend outpatient follow-up.  #Tobacco abuse: Nicotine patch patient was counseled.  #Frequent falls, physical deconditioning: CT MRI no acute finding.  PT/OT recommended a skilled nursing home.  Social worker consult.  #RPR and T. pallidum antibody positive/latent syphilis: Follow-up ID.  Patient is status post lumbar puncture.  Waiting for CSF VDRL test before starting treatment.  DVT prophylaxis: IV heparin Code Status: Full code Family Communication: Discussed with the patient's sister at bedside. Disposition Plan: Stepdown    Consultants:   Cardiology  Procedures: None Antimicrobials: None  Subjective: Seen and examined at bedside.  Denied nausea vomiting chest pain shortness of breath. Objective: Vitals:   11/11/17 0600 11/11/17 0700 11/11/17 0800 11/11/17 1100  BP:  (!) 108/58 106/64 (!) 106/54  Pulse:   84 96  Resp:   (!) 28 (!) 26  Temp:   98.1 F (36.7 C) 98.2 F (36.8 C)  TempSrc:   Oral Oral  SpO2:   100% 97%  Weight: 63.2 kg (139 lb 5.3 oz)     Height:        Intake/Output Summary (Last 24 hours) at 11/11/2017 1155 Last data filed at 11/11/2017 0900 Gross per 24 hour  Intake 1528.54 ml  Output 625 ml  Net 903.54 ml   Filed Weights   11/09/17 0500 11/10/17 0612 11/11/17 0600  Weight: 63.5 kg (139 lb 15.9 oz) 64.6 kg (142 lb 6.7 oz) 63.2 kg (139 lb 5.3 oz)    Examination:  General exam: Lying on bed comfortable. Respiratory system: Clear bilateral, respiratory effort normal.  No wheezing.  Cardiovascular system: Regular rate  rhythm S1-S2 normal.  No pedal edema.  Gastrointestinal system: Abdomen is nondistended, soft and nontender. Normal bowel sounds heard. Central nervous system: Alert awake and oriented to [facility], January 2019 and names. Extremities: Symmetric 5 x 5 power. Skin: No rashes, lesions or  ulcers Psychiatry: Judgement and insight appear normal. Mood & affect appropriate.     Data Reviewed: I have personally reviewed following labs and imaging studies  CBC: Recent Labs  Lab 11/06/17 1542 11/08/17 0253 11/09/17 0257 11/10/17 0305 11/11/17 0341  WBC 11.1* 8.0 8.8 7.7 8.6  HGB 15.6 13.6 12.7* 12.9* 13.0  HCT 47.1 42.2 39.1 39.9 39.3  MCV 96.5 98.4 99.0 96.6 97.3  PLT 136* 130* 130* 139* 152   Basic Metabolic Panel: Recent Labs  Lab 11/07/17 0843 11/07/17 1843 11/08/17 0253 11/09/17 0257 11/09/17 1813 11/10/17 0305 11/11/17 0341  NA  --  143 141 140  --  138 141  K  --  4.6 4.1 3.8  --  3.6 3.7  CL  --  105 101 97*  --  97* 97*  CO2  --  24 29 32  --  31 34*  GLUCOSE  --  99 74 116*  --  115* 109*  BUN  --  41* 39* 37*  --  30* 24*  CREATININE  --  1.79* 1.70* 1.81* 1.62* 1.42* 1.37*  CALCIUM  --  8.3* 8.2* 7.9*  --  7.8* 8.1*  MG 2.0  --   --  1.6*  --  2.0  --    GFR: Estimated Creatinine Clearance: 37.2 mL/min (A) (by C-G formula based on SCr of 1.37 mg/dL (H)). Liver Function Tests: Recent Labs  Lab 11/06/17 1651 11/09/17 0257  AST 78* 53*  ALT 59 41  ALKPHOS 110 101  BILITOT 1.1 0.7  PROT 6.0* 4.8*  ALBUMIN 3.6 2.8*   No results for input(s): LIPASE, AMYLASE in the last 168 hours. Recent Labs  Lab 11/06/17 1651  AMMONIA 22   Coagulation Profile: Recent Labs  Lab 11/06/17 1935  INR 1.21   Cardiac Enzymes: Recent Labs  Lab 11/06/17 1935 11/07/17 0152 11/07/17 0731  TROPONINI 0.29* 0.44* 0.37*   BNP (last 3 results) No results for input(s): PROBNP in the last 8760 hours. HbA1C: No results for input(s): HGBA1C in the last 72 hours. CBG: Recent Labs  Lab 11/10/17 1937 11/11/17 0015 11/11/17 0335 11/11/17 0852 11/11/17 1149  GLUCAP 130* 123* 103* 103* 143*   Lipid Profile: No results for input(s): CHOL, HDL, LDLCALC, TRIG, CHOLHDL, LDLDIRECT in the last 72 hours. Thyroid Function Tests: No results for input(s): TSH,  T4TOTAL, FREET4, T3FREE, THYROIDAB in the last 72 hours. Anemia Panel: No results for input(s): VITAMINB12, FOLATE, FERRITIN, TIBC, IRON, RETICCTPCT in the last 72 hours. Sepsis Labs: Recent Labs  Lab 11/06/17 1609 11/06/17 1935 11/06/17 2228  PROCALCITON  --  0.10  --   LATICACIDVEN 2.99* 2.5* 2.3*    Recent Results (from the past 240 hour(s))  Culture, blood (Routine X 2) w Reflex to ID Panel     Status: None   Collection Time: 11/06/17  7:40 PM  Result Value Ref Range Status   Specimen Description BLOOD RIGHT ANTECUBITAL  Final   Special Requests   Final    BOTTLES DRAWN AEROBIC AND ANAEROBIC Blood Culture adequate volume   Culture NO GROWTH 5 DAYS  Final   Report Status 11/11/2017 FINAL  Final  Culture, blood (Routine X 2) w Reflex to ID Panel  Status: None   Collection Time: 11/06/17  7:57 PM  Result Value Ref Range Status   Specimen Description BLOOD RIGHT HAND  Final   Special Requests IN PEDIATRIC BOTTLE Blood Culture adequate volume  Final   Culture NO GROWTH 5 DAYS  Final   Report Status 11/11/2017 FINAL  Final  Urine Culture     Status: None   Collection Time: 11/07/17 12:55 AM  Result Value Ref Range Status   Specimen Description URINE, RANDOM  Final   Special Requests NONE  Final   Culture NO GROWTH  Final   Report Status 11/08/2017 FINAL  Final  MRSA PCR Screening     Status: None   Collection Time: 11/07/17  9:57 PM  Result Value Ref Range Status   MRSA by PCR NEGATIVE NEGATIVE Final    Comment:        The GeneXpert MRSA Assay (FDA approved for NASAL specimens only), is one component of a comprehensive MRSA colonization surveillance program. It is not intended to diagnose MRSA infection nor to guide or monitor treatment for MRSA infections.   CSF culture     Status: None (Preliminary result)   Collection Time: 11/10/17  3:55 PM  Result Value Ref Range Status   Specimen Description CSF  Final   Special Requests NONE  Final   Gram Stain   Final      CYTOSPIN SMEAR WBC PRESENT, PREDOMINANTLY MONONUCLEAR NO ORGANISMS SEEN    Culture NO GROWTH 1 DAY  Final   Report Status PENDING  Incomplete         Radiology Studies: Dg Fluoro Guide Lumbar Puncture  Result Date: 11/10/2017 CLINICAL DATA:  84 year old male with positive VDRL, confusion and abnormal gait. Diagnostic lumbar puncture requested. EXAM: DIAGNOSTIC LUMBAR PUNCTURE UNDER FLUOROSCOPIC GUIDANCE FLUOROSCOPY TIME:  Fluoroscopy Time:  0 min 48 sec Radiation Exposure Index (if provided by the fluoroscopic device): Number of Acquired Spot Images: 4 COMPARISON:  Lumbar radiographs 06/08/2014. PROCEDURE: Informed consent was obtained from the patient prior to the procedure, including potential complications of headache, infection, and bleeding. A "time-out" was performed. With the patient prone, the lower back was prepped with Betadine. 1% Lidocaine was used for local anesthesia. Lower lumbar spondylolisthesis noted on the 2015 comparison. Several fluoroscopic guided LP attempts were made at both the L2-L3, and L1-L2 levels with the needle encountering bone each time. Finally, using sub laminar technique the needle was able to be advanced through the lamina at L1-L2 level, and lumbar puncture was performed at that level using a 3.5 in x 20 gauge needle with return of initially bloody but ultimately clear, colorless CSF. 16 mL of CSF were obtained for laboratory studies. The patient tolerated the procedure well. Minimal bleeding was encountered, direct pressure was held and hemostasis noted. There were no apparent complications. Appropriate post procedural orders were placed on the chart. The patient was returned to the inpatient floor in stable condition for continued treatment. IMPRESSION: Fluoroscopic guided lumbar puncture at L1-L2. CSF laboratory analysis pending. Electronically Signed   By: Odessa Fleming M.D.   On: 11/10/2017 16:10        Scheduled Meds: . aspirin  324 mg Oral Daily  .  atorvastatin  40 mg Oral q1800  . furosemide  40 mg Oral BID  . isosorbide-hydrALAZINE  0.5 tablet Oral TID  . mupirocin cream   Topical Daily  . nicotine  21 mg Transdermal Daily  . penicillin g benzathine (BICILLIN-LA) IM  2.4 Million Units Intramuscular  Weekly  . potassium chloride  40 mEq Oral Daily  . sodium chloride flush  3 mL Intravenous Q12H  . sodium chloride flush  3 mL Intravenous Q12H   Continuous Infusions: . sodium chloride    . sodium chloride    . dextrose 30 mL/hr at 11/11/17 0800  . DOPamine 5 mcg/kg/min (11/11/17 0800)  . heparin 1,050 Units/hr (11/11/17 0800)  . milrinone 0.25 mcg/kg/min (11/11/17 1035)     LOS: 5 days    Orah Sonnen Jaynie CollinsPrasad Alixandrea Milleson, MD [facility] Pager [phone]  If 7PM-7AM, please contact night-coverage [email or web address] Password [email or web address] 11/11/2017, 11:55 AM

## 2017-11-11 NOTE — Progress Notes (Signed)
ANTICOAGULATION CONSULT NOTE - Follow Up Consult  Pharmacy Consult for Heparin  Indication: chest pain/ACS and atrial fibrillation, s/p cath, s/p LP for encephalopathy   No Known Allergies  Patient Measurements: Height: 5\' 6"  (167.6 cm) Weight: 139 lb 5.3 oz (63.2 kg) IBW/kg (Calculated) : 63.8  Vital Signs: Temp: 98.2 F (36.8 C) (01/18 1100) Temp Source: Oral (01/18 1100) BP: 106/54 (01/18 1100) Pulse Rate: 96 (01/18 1100)  Labs: Recent Labs    11/09/17 0257 11/09/17 1813 11/10/17 0303 11/10/17 0305 11/11/17 0341 11/11/17 1204  HGB 12.7*  --   --  12.9* 13.0  --   HCT 39.1  --   --  39.9 39.3  --   PLT 130*  --   --  139* 152  --   HEPARINUNFRC 0.49  --  0.34  --  0.22* 0.35  CREATININE 1.81* 1.62*  --  1.42* 1.37*  --     Estimated Creatinine Clearance: 37.2 mL/min (A) (by C-G formula based on SCr of 1.37 mg/dL (H)).   Assessment: 84 y/o M on heparin for afib, also s/p cath with multi-vessel disease, not a CABG candidate, will likely need PCI, also s/p LP for encephalopathy, heparin resumed yesterday 4 hours s/p LP yesterday.   Heparin level this afternoon came back therapeutic at 0.35, on 1050 units/hr drawn early (6 hours). CBC is stable. No signs/symptoms of bleeding. No infusion issues.    Goal of Therapy:  Heparin level 0.3-0.7 units/ml Monitor platelets by anticoagulation protocol: Yes   Plan:  Continue heparin at 1050 units/hr Monitor daily HL and CBC  Monitor for signs/symptoms of bleeding  Girard CooterKimberly Perkins, PharmD Clinical Pharmacist  Pager: [phone] Clinical Phone for 11/11/2017 until 3:30pm: [phone] If after 3:30pm, please call main pharmacy at [phone] 11/11/2017,2:58 PM

## 2017-11-11 NOTE — Progress Notes (Signed)
ANTICOAGULATION CONSULT NOTE - Follow Up Consult  Pharmacy Consult for Heparin  Indication: chest pain/ACS and atrial fibrillation, s/p cath, s/p LP for encephalopathy   No Known Allergies  Patient Measurements: Height: 5\' 6"  (167.6 cm) Weight: 142 lb 6.7 oz (64.6 kg) IBW/kg (Calculated) : 63.8  Vital Signs: Temp: 99 F (37.2 C) (01/18 0333) Temp Source: Oral (01/18 0333) BP: 114/65 (01/18 0500) Pulse Rate: 43 (01/18 0500)  Labs: Recent Labs    11/09/17 0257 11/09/17 1813 11/10/17 0303 11/10/17 0305 11/11/17 0341  HGB 12.7*  --   --  12.9* 13.0  HCT 39.1  --   --  39.9 39.3  PLT 130*  --   --  139* 152  HEPARINUNFRC 0.49  --  0.34  --  0.22*  CREATININE 1.81* 1.62*  --  1.42*  --     Estimated Creatinine Clearance: 36.2 mL/min (A) (by C-G formula based on SCr of 1.42 mg/dL (H)).   Assessment: 84 y/o M on heparin for afib, also s/p cath with multi-vessel disease, not a CABG candidate, will likely need PCI, also s/p LP for encephalopathy, heparin resumed yesterday 4 hours s/p LP, heparin level is low this AM, no issues per RN.   Goal of Therapy:  Heparin level 0.3-0.7 units/ml Monitor platelets by anticoagulation protocol: Yes   Plan:  Inc heparin to 1050 units/hr 1400 HL  Andreyah Natividad 11/11/2017,5:56 AM

## 2017-11-11 NOTE — Progress Notes (Signed)
  Speech Language Pathology Treatment: Dysphagia  Patient Details Name: [name] [name] MRN: [id number] DOB: 1933-09-04 Today's Date: 11/11/2017 Time: 3810-1751 SLP Time Calculation (min) (ACUTE ONLY): 17 min  Assessment / Plan / Recommendation Clinical Impression  Pt with excellent toleration of regular solids, thin liquids with adequate mastication, brisk swallow response, no overt s/s of aspiration. Sets up tray, feeds himself independently.  No concerns today for incoordination of swallow/respiratory patterns.  Lung sounds are clear; pt remains afebrile. No further SLP tx is needed - our services will sign off.  Pt verbalizes agreement.   HPI HPI: [name] Crumpis a 84 y.o.malewith medical history significant ofhypertension, hypothyroidism, CAD, myocardial infarction,s/p ofstent placement, tobacco abuse, CKD-2, who presents with shortness breath, confusion,poor balance,fall and diarrhea. Recent diagnosis of new onset CHF. Found to havepositive troponin 0.13, abnormal liver functions, worsening renal function, chest-ray showed vascular congestion and a bilateral small pleural effusion. CT head is negative for acute intracranial abnormalities.      SLP Plan  All goals met       Recommendations  Diet recommendations: Regular;Thin liquid Liquids provided via: Cup;Straw Medication Administration: Whole meds with liquid Supervision: Patient able to self feed                Oral Care Recommendations: Oral care BID Follow up Recommendations: None SLP Visit Diagnosis: Dysphagia, unspecified (R13.10) Plan: All goals met       GO                [name] [name] 11/11/2017, 10:07 AM

## 2017-11-11 NOTE — Progress Notes (Signed)
Occupational Therapy Treatment Patient Details Name: Buren Havey MRN: [id number] DOB: 03/13/1933 Today's Date: 11/11/2017    History of present illness Pt is a 84 yo male admitted 10/27/17 with SOB, confusion and poor balance. Pt found to have a new CHF exacerbation. 2D echo on 1/14 revealed LVEF 15-20% complicated by Aflutter. S/p left heart cath 1/17 showing severe multivessel CAD. S/p lumbar puncture 1/17. PMH includes CAD, HTN, MI, CKD and SOB.     OT comments  Pt is making slow, steady gains with OT.  He continues to require min A for LB ADLs due to balance deficits coupled with decreased awareness of safety and his deficits. VSS with sats >92% on 2L supplemental 02.   Recommend SNF   Follow Up Recommendations  SNF;Supervision/Assistance - 24 hour    Equipment Recommendations  3 in 1 bedside commode    Recommendations for Other Services      Precautions / Restrictions Precautions Precautions: Fall Precaution Comments: Watch HR       Mobility Bed Mobility Overal bed mobility: Needs Assistance Bed Mobility: Supine to Sit;Sit to Supine     Supine to sit: Supervision Sit to supine: Supervision      Transfers Overall transfer level: Needs assistance Equipment used: Rolling walker (2 wheeled) Transfers: Sit to/from [employer] Sit to Stand: Min guard Stand pivot transfers: Min assist       General transfer comment: verbal cues for safe hand placement.  Min A for balance when turning     Balance Overall balance assessment: Needs assistance Sitting-balance support: Feet supported Sitting balance-Leahy Scale: Good     Standing balance support: Bilateral upper extremity supported Standing balance-Leahy Scale: Poor Standing balance comment: reliant on UE support                            ADL either performed or assessed with clinical judgement   ADL Overall ADL's : Needs assistance/impaired                     Lower Body  Dressing: Minimal assistance;Sit to/from stand   Toilet Transfer: Minimal assistance;Ambulation;Comfort height toilet;Grab bars;RW   Toileting- Clothing Manipulation and Hygiene: Minimal assistance;Sit to/from stand       Functional mobility during ADLs: Minimal assistance General ADL Comments: requires assist for balance, and min cues for safety and sequencing      Vision       Perception     Praxis      Cognition Arousal/Alertness: Awake/alert Behavior During Therapy: WFL for tasks assessed/performed Overall Cognitive Status: Impaired/Different from baseline Area of Impairment: Attention;Safety/judgement;Awareness;Problem solving                   Current Attention Level: Selective     Safety/Judgement: Decreased awareness of safety;Decreased awareness of deficits Awareness: Intellectual Problem Solving: Difficulty sequencing;Requires verbal cues          Exercises     Shoulder Instructions       General Comments VSS.  Pt utilized 2L supplemental 02 with sats >93%    Pertinent Vitals/ Pain       Pain Assessment: No/denies pain  Home Living                                          Prior Functioning/Environment  Frequency  Min 2X/week        Progress Toward Goals  OT Goals(current goals can now be found in the care plan section)  Progress towards OT goals: Progressing toward goals     Plan Discharge plan remains appropriate    Co-evaluation                 AM-PAC PT "6 Clicks" Daily Activity     Outcome Measure   Help from another person eating meals?: None Help from another person taking care of personal grooming?: A Little Help from another person toileting, which includes using toliet, bedpan, or urinal?: A Little Help from another person bathing (including washing, rinsing, drying)?: A Little Help from another person to put on and taking off regular upper body clothing?: A Little Help  from another person to put on and taking off regular lower body clothing?: A Little 6 Click Score: 19    End of Session Equipment Utilized During Treatment: Rolling walker;Oxygen  OT Visit Diagnosis: Unsteadiness on feet (R26.81)   Activity Tolerance Patient tolerated treatment well   Patient Left in bed;with call bell/phone within reach;with bed alarm set;with family/visitor present   Nurse Communication Mobility status        Time: [id number] OT Time Calculation (min): 24 min  Charges: OT General Charges $OT Visit: 1 Visit OT Treatments $Therapeutic Activity: 23-37 mins  Reynolds AmericanWendi Jebediah Macrae, OTR/L [phone]    Jeani HawkingConarpe, Tyrek Lawhorn M 11/11/2017, 9:43 PM

## 2017-11-11 NOTE — Consult Note (Signed)
[facility]   Reason for Consult: Positive RPR      Referring Physician: Ronalee Belts   Principal Problem:   Acute diastolic (congestive) heart failure (HCC) Active Problems:   Hypothyroidism   TOBACCO ABUSE   HYPERTENSION, BENIGN ESSENTIAL   CAD (coronary artery disease)   Acute metabolic encephalopathy   Elevated troponin   Fall   Lactic acidosis   Diarrhea   Acute renal failure superimposed on stage 2 chronic kidney disease (HCC)   Abnormal LFTs   Acute on chronic respiratory failure with hypoxia (HCC)   CHF exacerbation (HCC)   . aspirin  324 mg Oral Daily  . atorvastatin  40 mg Oral q1800  . furosemide  40 mg Oral BID  . isosorbide-hydrALAZINE  0.5 tablet Oral TID  . mupirocin cream   Topical Daily  . nicotine  21 mg Transdermal Daily  . potassium chloride  40 mEq Oral Daily  . sodium chloride flush  3 mL Intravenous Q12H  . sodium chloride flush  3 mL Intravenous Q12H    Recommendations: Will initiate treatment for latent syphilis, IM Penicillin G qweekly x 3 doses. Low probability of neurosyphilis. F/u CSF studies. If VDRL positive, will need to adjust treatment duration. Will sign off, please re consult with any questions.   Assessment: Patient with transient encephalopathy x 2 days, now resolved per family. RPR checked on admission resulted positive with low titer 1:8. Likely incidental. Clinical exam and CSF studies thus far appear inconsistent with neurosyphilis (WBC < 5 and protein < 45). Will follow up final CSF studies.   Antibiotics: IM penicillin G  HPI: [name] [name] is a 84 y.o. male with a pmhx of HTN, CAD, dCHF, CKD stage II, and hypothyroidism who presented 1/13 with SOB, confusion, and fall. Per report, patient endorsed SOB x 2 months that had progressively worsened.  He was diagnosed as an outpatient with new onset CHF and being diuresed with lasix. He also endorsed diarrhea, poor balance, and a fall 1 week prior to  presentation. In the ED, pt was found to have positive troponin 0.13, BNP 2055, WBC 11.1, lactic acid 2.99, abnormal liver functions with AST 78, ALT 59, normal ALP and total bilirubin, ammonia 22, negative urinalysis, worsening renal function, temperature normal, no tachycardia, has tachypnea, oxygen saturation to 84% on room air, chest x-ray showed vascular congestion and a bilateral small pleural effusion, CT head was negative for acute intracranial abnormalities.   He was admitted for CHF exacerbation, IV diuresis, and concern for ACS due to elevated troponin. Echocardiogram revealed EF of 15-20%. Cardiology was consulted. He underwent LHC on 1/17 which revealed multivessel disease. He was felt to be a poor surgical candidate for CABG. Although PCI was indicated, this was delayed due to ongoing issues with encephalopathy warranting further work up.   Interval History:  Patient had a serum RPR antibody checked on 1/14 that resulted positive with a 1:8 ratio. HIV antibody non reactive. LP performed 1/17 with glucose 77, total protein 32, 340 rbcs, and 1 wbc. CSF VDRL, gram stain, and culture are pending at this time.  Patient has remained afebrile, hemodynamically stable, with normal wbc. Patient reports being sexually active, however family at bedside reports he has not been sexually active for the past 1-2 years. Per family, patient experienced transient confusion for 2 days prior to admission that has now resolved. He is back to his baseline mental status, A&O x 3 today.   Review of Systems:  Review of Systems  Constitutional: Negative for chills, fever and weight loss.  Eyes: Negative for blurred vision.  Genitourinary:       Denies genital lesions  Skin: Negative for rash.    Past Medical History:  Diagnosis Date  . CHF (congestive heart failure) (HCC)   . CKD (chronic kidney disease), stage II   . Heart attack (HCC)   . Hypertension     Social History   Tobacco Use  . Smoking  status: Heavy Tobacco Smoker    Packs/day: 0.50    Years: 70.00    Pack years: 35.00  . Smokeless tobacco: Never Used  Substance Use Topics  . Alcohol use: No  . Drug use: No    Family History  Problem Relation Age of Onset  . Aneurysm Mother   . Heart failure Brother     No Known Allergies  Physical Exam: Vitals:   11/11/17 0700 11/11/17 0800  BP: (!) 108/58 106/64  Pulse:  84  Resp:  (!) 28  Temp:  98.1 F (36.7 C)  SpO2:  100%   Constitutional: NAD, appears comfortable HEENT: Atraumatic, normocephalic. PERRL, anicteric sclera.  Neck: Supple, trachea midline.  Cardiovascular: RRR, no murmurs, rubs, or gallops.  Pulmonary/Chest: CTAB, no wheezes, rales, or rhonchi.  Abdominal: Soft, non tender, non distended. +BS.  GU: Condom catheter in place, no genital lesions identified. Normal exam.  Extremities: Warm and well perfused.  Neurological: A&Ox3, CN II - XII grossly intact.  Skin: No rashes or erythema  Psychiatric: Normal mood and affect   Lab Results  Component Value Date   WBC 8.6 11/11/2017   HGB 13.0 11/11/2017   HCT 39.3 11/11/2017   MCV 97.3 11/11/2017   PLT 152 11/11/2017    Lab Results  Component Value Date   CREATININE 1.37 (H) 11/11/2017   BUN 24 (H) 11/11/2017   NA 141 11/11/2017   K 3.7 11/11/2017   CL 97 (L) 11/11/2017   CO2 34 (H) 11/11/2017    Lab Results  Component Value Date   ALT 41 11/09/2017   AST 53 (H) 11/09/2017   ALKPHOS 101 11/09/2017     Microbiology: Recent Results (from the past 240 hour(s))  Culture, blood (Routine X 2) w Reflex to ID Panel     Status: None (Preliminary result)   Collection Time: 11/06/17  7:40 PM  Result Value Ref Range Status   Specimen Description BLOOD RIGHT ANTECUBITAL  Final   Special Requests   Final    BOTTLES DRAWN AEROBIC AND ANAEROBIC Blood Culture adequate volume   Culture NO GROWTH 4 DAYS  Final   Report Status PENDING  Incomplete  Culture, blood (Routine X 2) w Reflex to ID Panel      Status: None (Preliminary result)   Collection Time: 11/06/17  7:57 PM  Result Value Ref Range Status   Specimen Description BLOOD RIGHT HAND  Final   Special Requests IN PEDIATRIC BOTTLE Blood Culture adequate volume  Final   Culture NO GROWTH 4 DAYS  Final   Report Status PENDING  Incomplete  Urine Culture     Status: None   Collection Time: 11/07/17 12:55 AM  Result Value Ref Range Status   Specimen Description URINE, RANDOM  Final   Special Requests NONE  Final   Culture NO GROWTH  Final   Report Status 11/08/2017 FINAL  Final  MRSA PCR Screening     Status: None   Collection Time: 11/07/17  9:57 PM  Result  Value Ref Range Status   MRSA by PCR NEGATIVE NEGATIVE Final    Comment:        The GeneXpert MRSA Assay (FDA approved for NASAL specimens only), is one component of a comprehensive MRSA colonization surveillance program. It is not intended to diagnose MRSA infection nor to guide or monitor treatment for MRSA infections.   CSF culture     Status: None (Preliminary result)   Collection Time: 11/10/17  3:55 PM  Result Value Ref Range Status   Specimen Description CSF  Final   Special Requests NONE  Final   Gram Stain   Final    CYTOSPIN SMEAR WBC PRESENT, PREDOMINANTLY MONONUCLEAR NO ORGANISMS SEEN    Culture PENDING  Incomplete   Report Status PENDING  Incomplete    Reymundo Poll, MD - PGY2  [facility] [email or web address] [phone] pager  [phone] cell 11/11/2017, 10:05 AM

## 2017-11-12 ENCOUNTER — Inpatient Hospital Stay (HOSPITAL_COMMUNITY): Payer: Medicare HMO

## 2017-11-12 DIAGNOSIS — I480 Paroxysmal atrial fibrillation: Secondary | ICD-10-CM

## 2017-11-12 DIAGNOSIS — I509 Heart failure, unspecified: Secondary | ICD-10-CM

## 2017-11-12 DIAGNOSIS — J9621 Acute and chronic respiratory failure with hypoxia: Secondary | ICD-10-CM

## 2017-11-12 DIAGNOSIS — I5031 Acute diastolic (congestive) heart failure: Secondary | ICD-10-CM

## 2017-11-12 DIAGNOSIS — R0602 Shortness of breath: Secondary | ICD-10-CM

## 2017-11-12 LAB — GLUCOSE, CAPILLARY
GLUCOSE-CAPILLARY: 132 mg/dL — AB (ref 65–99)
Glucose-Capillary: 121 mg/dL — ABNORMAL HIGH (ref 65–99)
Glucose-Capillary: 87 mg/dL (ref 65–99)

## 2017-11-12 LAB — RENAL FUNCTION PANEL
Albumin: 2.7 g/dL — ABNORMAL LOW (ref 3.5–5.0)
Anion gap: 9 (ref 5–15)
BUN: 24 mg/dL — AB (ref 6–20)
CHLORIDE: 98 mmol/L — AB (ref 101–111)
CO2: 31 mmol/L (ref 22–32)
CREATININE: 1.3 mg/dL — AB (ref 0.61–1.24)
Calcium: 8 mg/dL — ABNORMAL LOW (ref 8.9–10.3)
GFR calc Af Amer: 57 mL/min — ABNORMAL LOW (ref >60)
GFR, EST NON AFRICAN AMERICAN: 49 mL/min — AB (ref >60)
GLUCOSE: 135 mg/dL — AB (ref 65–99)
PHOSPHORUS: 2 mg/dL — AB (ref 2.5–4.6)
Potassium: 4 mmol/L (ref 3.5–5.1)
Sodium: 138 mmol/L (ref 135–145)

## 2017-11-12 LAB — CBC
HCT: 38.8 % — ABNORMAL LOW (ref 39.0–52.0)
Hemoglobin: 12.5 g/dL — ABNORMAL LOW (ref 13.0–17.0)
MCH: 31.3 pg (ref 26.0–34.0)
MCHC: 32.2 g/dL (ref 30.0–36.0)
MCV: 97 fL (ref 78.0–100.0)
Platelets: 149 10*3/uL — ABNORMAL LOW (ref 150–400)
RBC: 4 MIL/uL — AB (ref 4.22–5.81)
RDW: 15.1 % (ref 11.5–15.5)
WBC: 8 10*3/uL (ref 4.0–10.5)

## 2017-11-12 LAB — BLOOD GAS, ARTERIAL
Acid-Base Excess: 9.6 mmol/L — ABNORMAL HIGH (ref 0.0–2.0)
Bicarbonate: 33.2 mmol/L — ABNORMAL HIGH (ref 20.0–28.0)
Drawn by: 42624
O2 Content: 10 L/min
O2 Saturation: 89.7 %
Patient temperature: 98.6
pCO2 arterial: 42.1 mmHg (ref 32.0–48.0)
pH, Arterial: 7.509 — ABNORMAL HIGH (ref 7.350–7.450)
pO2, Arterial: 54.9 mmHg — ABNORMAL LOW (ref 83.0–108.0)

## 2017-11-12 LAB — HEPARIN LEVEL (UNFRACTIONATED): HEPARIN UNFRACTIONATED: 0.37 [IU]/mL (ref 0.30–0.70)

## 2017-11-12 MED ORDER — DIGOXIN 125 MCG PO TABS
0.1250 mg | ORAL_TABLET | Freq: Every day | ORAL | Status: DC
  Administered 2017-11-13 – 2017-11-15 (×3): 0.125 mg via ORAL
  Filled 2017-11-12 (×3): qty 1

## 2017-11-12 MED ORDER — ENOXAPARIN SODIUM 60 MG/0.6ML ~~LOC~~ SOLN
60.0000 mg | Freq: Two times a day (BID) | SUBCUTANEOUS | Status: DC
  Administered 2017-11-12 – 2017-11-13 (×3): 60 mg via SUBCUTANEOUS
  Filled 2017-11-12 (×5): qty 0.6

## 2017-11-12 MED ORDER — LORAZEPAM 2 MG/ML IJ SOLN
0.5000 mg | Freq: Once | INTRAMUSCULAR | Status: AC
  Administered 2017-11-12: 0.5 mg via INTRAVENOUS
  Filled 2017-11-12: qty 1

## 2017-11-12 MED ORDER — SODIUM CHLORIDE 0.9 % IV BOLUS (SEPSIS)
250.0000 mL | Freq: Once | INTRAVENOUS | Status: AC
  Administered 2017-11-12: 250 mL via INTRAVENOUS

## 2017-11-12 MED ORDER — DILTIAZEM HCL 25 MG/5ML IV SOLN
5.0000 mg | Freq: Once | INTRAVENOUS | Status: DC
  Filled 2017-11-12: qty 5

## 2017-11-12 MED ORDER — AMIODARONE HCL IN DEXTROSE 360-4.14 MG/200ML-% IV SOLN
60.0000 mg/h | INTRAVENOUS | Status: AC
  Administered 2017-11-12 (×2): 60 mg/h via INTRAVENOUS
  Filled 2017-11-12 (×2): qty 200

## 2017-11-12 MED ORDER — DIGOXIN 0.25 MG/ML IJ SOLN
0.5000 mg | Freq: Every day | INTRAMUSCULAR | Status: AC
  Administered 2017-11-12: 0.5 mg via INTRAVENOUS
  Filled 2017-11-12: qty 2

## 2017-11-12 MED ORDER — FUROSEMIDE 10 MG/ML IJ SOLN
40.0000 mg | Freq: Two times a day (BID) | INTRAMUSCULAR | Status: DC
  Administered 2017-11-12 – 2017-11-14 (×4): 40 mg via INTRAVENOUS
  Filled 2017-11-12 (×4): qty 4

## 2017-11-12 MED ORDER — DOPAMINE-DEXTROSE 3.2-5 MG/ML-% IV SOLN
5.0000 ug/kg/min | INTRAVENOUS | Status: DC
  Administered 2017-11-12: 5 ug/kg/min via INTRAVENOUS
  Filled 2017-11-12: qty 250

## 2017-11-12 MED ORDER — AMIODARONE HCL IN DEXTROSE 360-4.14 MG/200ML-% IV SOLN
30.0000 mg/h | INTRAVENOUS | Status: DC
  Administered 2017-11-12 – 2017-11-13 (×2): 30 mg/h via INTRAVENOUS
  Filled 2017-11-12 (×2): qty 200

## 2017-11-12 NOTE — Progress Notes (Signed)
Dopamine decreased to 275mcg/kg/min per orders.  SBP remaining >90 with map >65.  HR remains 140's.  Awaiting return call from cardiology.

## 2017-11-12 NOTE — Progress Notes (Addendum)
Subjective:  Events that occurred overnight, patient developed acute respiratory distress with A. fib with RVR and eventually transferred to the intensive care unit.  I was contacted around 2:50 AM, gave verbal orders and also discussed with nurse practitioner on call.  Patient this morning is much stable, not in acute distress, vital signs remained stable and back in sinus rhythm.  Objective:  Vital Signs in the last 24 hours: Temp:  [97.5 F (36.4 C)-98.3 F (36.8 C)] 97.9 F (36.6 C) (01/19 0833) Pulse Rate:  [49-154] 92 (01/19 0845) Resp:  [11-42] 42 (01/19 0845) BP: (65-126)/(43-98) 105/75 (01/19 0845) SpO2:  [87 %-100 %] 99 % (01/19 0845) Weight:  [64.7 kg (142 lb 10.2 oz)] 64.7 kg (142 lb 10.2 oz) (01/19 0500)  Intake/Output from previous day: 01/18 0701 - 01/19 0700 In: 2069.4 [P.O.:600; I.V.:1469.4] Out: 1425 [Urine:1425]  Physical Exam:  General appearance: alert, cooperative, appears older than stated age, mild distress and increased work of breathing. Does not appear to be in acute distress. Ill looking. Eyes: negative findings: lids and lashes normal Neck: JVD to the angle of the jaw.  Neck: JVP - normal, carotids 2+= without bruits Resp: Barrel-shaped chest.  Decreased air entry bilateral, coarse crackles bilateral bases, occasional expiratory wheeze. Chest wall: no tenderness Cardio: regular rate and rhythm, S1, S2 normal, no murmur, click, rub or gallop and Distant heart sounds.  S1 and S2 is normal, no gallop or murmur appreciated.  Frequent ectopy present GI: soft, non-tender; bowel sounds normal; no masses,  no organomegaly and Liver is just felt below the right costal margin.  Mildly tender. Extremities: extremities normal, atraumatic, no cyanosis or edema    Lab Results: BMP Recent Labs    11/10/17 0305 11/11/17 0341 11/12/17 0358  NA 138 141 138  K 3.6 3.7 4.0  CL 97* 97* 98*  CO2 31 34* 31  GLUCOSE 115* 109* 135*  BUN 30* 24* 24*  CREATININE  1.42* 1.37* 1.30*  CALCIUM 7.8* 8.1* 8.0*  GFRNONAA 44* 46* 49*  GFRAA 52* 54* 57*    CBC Recent Labs  Lab 11/12/17 0358  WBC 8.0  RBC 4.00*  HGB 12.5*  HCT 38.8*  PLT 149*  MCV 97.0  MCH 31.3  MCHC 32.2  RDW 15.1    HEMOGLOBIN A1C Lab Results  Component Value Date   HGBA1C 6.3 (H) 11/07/2017   MPG 134.11 11/07/2017    Cardiac Panel (last 3 results) Recent Labs    11/06/17 1935 11/07/17 0152 11/07/17 0731  TROPONINI 0.29* 0.44* 0.37*    BNP (last 3 results) No results for input(s): PROBNP in the last 8760 hours.  TSH Recent Labs    11/07/17 0152  TSH 1.340    Lipid Panel     Component Value Date/Time   CHOL 149 11/07/2017 0201   TRIG 64 11/07/2017 0201   HDL 42 11/07/2017 0201   CHOLHDL 3.5 11/07/2017 0201   VLDL 13 11/07/2017 0201   LDLCALC 94 11/07/2017 0201     Hepatic Function Panel Recent Labs    04/13/17 0028 11/06/17 1651 11/09/17 0257 11/12/17 0358  PROT 7.8 6.0* 4.8*  --   ALBUMIN 4.2 3.6 2.8* 2.7*  AST 22 78* 53*  --   ALT 10* 59 41  --   ALKPHOS 99 110 101  --   BILITOT 0.4 1.1 0.7  --     Imaging: Dg Chest Port 1 View  Result Date: 11/12/2017 CLINICAL DATA:  84 year old male with shortness of  breath. EXAM: PORTABLE CHEST 1 VIEW COMPARISON:  Chest radiograph dated 11/07/2017 FINDINGS: There is cardiomegaly with overall increase in the size of the cardiopericardial silhouette compared to the prior radiograph. Vascular congestion also progressed. Patchy airspace density at the right lung base/infrahilar region may represent atelectasis or pneumonia. No pneumothorax. Degenerative changes of the shoulders. No acute osseous pathology. IMPRESSION: Interval increase in size of the heart and progression of vascular congestion compared to the prior radiograph. Right lung base airspace haziness, atelectasis versus infiltrate. Clinical correlation is recommended. Electronically Signed   By: Elgie Collard M.D.   On: 11/12/2017 03:02    Dg Fluoro Guide Lumbar Puncture  Result Date: 11/10/2017 CLINICAL DATA:  84 year old male with positive VDRL, confusion and abnormal gait. Diagnostic lumbar puncture requested. EXAM: DIAGNOSTIC LUMBAR PUNCTURE UNDER FLUOROSCOPIC GUIDANCE FLUOROSCOPY TIME:  Fluoroscopy Time:  0 min 48 sec Radiation Exposure Index (if provided by the fluoroscopic device): Number of Acquired Spot Images: 4 COMPARISON:  Lumbar radiographs 06/08/2014. PROCEDURE: Informed consent was obtained from the patient prior to the procedure, including potential complications of headache, infection, and bleeding. A "time-out" was performed. With the patient prone, the lower back was prepped with Betadine. 1% Lidocaine was used for local anesthesia. Lower lumbar spondylolisthesis noted on the 2015 comparison. Several fluoroscopic guided LP attempts were made at both the L2-L3, and L1-L2 levels with the needle encountering bone each time. Finally, using sub laminar technique the needle was able to be advanced through the lamina at L1-L2 level, and lumbar puncture was performed at that level using a 3.5 in x 20 gauge needle with return of initially bloody but ultimately clear, colorless CSF. 16 mL of CSF were obtained for laboratory studies. The patient tolerated the procedure well. Minimal bleeding was encountered, direct pressure was held and hemostasis noted. There were no apparent complications. Appropriate post procedural orders were placed on the chart. The patient was returned to the inpatient floor in stable condition for continued treatment. IMPRESSION: Fluoroscopic guided lumbar puncture at L1-L2. CSF laboratory analysis pending. Electronically Signed   By: Odessa Fleming M.D.   On: 11/10/2017 16:10    Cardiac Studies:  EKG: EKG 11/12/2017: April fibrillation with rapid ventricular response, nonspecific ST-T abnormality.  Telemetry: Normal sinus rhythm, frequent PVCs at 8:45 AM on 11/12/2017.  ECHO: Left ventricle: The cavity size was  mildly dilated. The estimated   ejection fraction was in the range of 15% to 20%. Severe diffuse   global hypokinesis. Grade II diastolic dysfunction. Elevated LAP. - Aortic valve: There was moderate regurgitation. - Mitral valve: Mildly dilated annulus. There was mild to moderate   regurgitation. - Left atrium: The atrium was moderately dilated. - Right ventricle: The cavity size was mildly dilated. Systolic   function was moderately reduced. - Right atrium: The atrium was mildly dilated. - Tricuspid valve: Mildly dilated annulus. There was   moderate-severe regurgitation. - Pulmonary arteries: PA peak pressure: 46 mm Hg (S). May be  underestimated due to elevated RA pressure - Pericardium, extracardiac: There was a left pleural effusion.  Coronary angiogram 11/10/2017:  Severe multivessel coronary artery disease LM: Normal; LAD/Diag bifurcation 90% stenosis (1,1,0); Medium sized ramus mid 90% stenosis LCx prox 40% Grade 1 to 2 left-to-right collaterals Prox RCA 100% Elevated LVEDP. Severe biventricular failure  Scheduled Meds: . aspirin  324 mg Oral Daily  . atorvastatin  40 mg Oral q1800  . mupirocin cream   Topical Daily  . nicotine  21 mg Transdermal Daily  . penicillin  g benzathine (BICILLIN-LA) IM  2.4 Million Units Intramuscular Weekly  . potassium chloride  40 mEq Oral Daily  . sodium chloride flush  3 mL Intravenous Q12H  . sodium chloride flush  3 mL Intravenous Q12H   Continuous Infusions: . sodium chloride 250 mL (11/12/17 0700)  . amiodarone 30 mg/hr (11/12/17 0918)  . dextrose 30 mL/hr at 11/12/17 0700  . DOPamine 5 mcg/kg/min (11/12/17 1610)  . heparin 1,050 Units/hr (11/12/17 0700)   PRN Meds:.sodium chloride, acetaminophen, dextromethorphan-guaiFENesin, hydrALAZINE, ipratropium-albuterol, metoprolol tartrate, morphine injection, nitroGLYCERIN, ondansetron (ZOFRAN) IV, sodium chloride flush, sodium chloride flush   Assessment/Plan:  1.  Acute on chronic systolic  and diastolic heart failure with biventricular failure 2.  Acute on chronic cor pulmonale secondary to underlying severe COPD and emphysema. 3.  Multivessel coronary artery disease, not felt to be a surgical candidate. 4.  Chronic stage III kidney disease, serum creatinine is stable. 5.  Probable aspiration pneumonia versus pneumonia right base, new chest x-ray finding.  Worsening heart failure on chest x-ray.  Recommendation: Extremely difficult situation.  Patient is critically ill and has biventricular failure and worsening heart failure symptoms in spite of aggressive medical management.  His underlying medical comorbidities including severe COPD and emphysema, chronic renal insufficiency followed by weight advanced heart failure therapy.  He is not a candidate for revascularization of any kind at this time.  I had a lengthy discussion with the patient's son, Francee Piccolo and RN Efraim Kaufmann witnessed my discussions along with direct face-to-face discussion with the patient regarding CODE STATUS and they are in agreement to make the patient DNR. Telephone conversation and discussion for end of life for additional 15 min.  He is maintaining sinus rhythm on amiodarone.  I would like to continue dopamine for inotropic support for at least another 24 hours and if he tolerates that may be 48 hours, will reduce the dopamine down to 2.5 mcg/kg/min if possible as long as he maintains mean pressure of greater than 60-70 mmHg.  I am fearful of adding dobutamine as it may again cause hypotension.  He did not tolerate Primacor for greater than 24 hours.  He has been coughing fairly incessantly, he has a new right basal infiltrate on chest x-ray, suspect he may have had aspiration.  At present, beta-blockers and BiDil has been discontinued due to hypotension.  If he remains stable we can consider reinitiation of BiDil and avoid beta-blocker for now due to acute decompensated heart failure and low blood  pressure.   Addendum: Patient back in A. FIb/flutter with RVR. Will digitize the patient, turn off dopamine, change heparin to lovenox to reduce fluid overload.  Yates Decamp, M.D. 11/12/2017, 8:57 AM [facility] Pager: [phone] Office: [phone] If no answer: [phone]

## 2017-11-12 NOTE — Progress Notes (Signed)
Awaiting call from cardiology.  Hospitalist notified of patient's increased heart rate with tachypnea.  RR 40's O2 sat 91% on 7L HF nasal cannula.    Orders received for IV cardizem, Stat CXR, abg and bipap.  RT notified of abg and bipap orders.

## 2017-11-12 NOTE — Plan of Care (Signed)
  Progressing Education: Knowledge of General Education information will improve 11/12/2017 2103 - Progressing by Jasper RilingSarine, Calianna Kim M, RN Health Behavior/Discharge Planning: Ability to manage health-related needs will improve 11/12/2017 2103 - Progressing by Jasper RilingSarine, Karaline Buresh M, RN Clinical Measurements: Ability to maintain clinical measurements within normal limits will improve 11/12/2017 2103 - Progressing by Jasper RilingSarine, Zayvon Alicea M, RN Will remain free from infection 11/12/2017 2103 - Progressing by Jasper RilingSarine, Jaley Yan M, RN Diagnostic test results will improve 11/12/2017 2103 - Progressing by Jasper RilingSarine, Ishani Goldwasser M, RN Respiratory complications will improve 11/12/2017 2103 - Progressing by Jasper RilingSarine, Lucy Boardman M, RN Cardiovascular complication will be avoided 11/12/2017 2103 - Progressing by Jasper RilingSarine, Yaret Hush M, RN Activity: Risk for activity intolerance will decrease 11/12/2017 2103 - Progressing by Jasper RilingSarine, Shyquan Stallbaumer M, RN Note Pt remains SOB with activities due to chronic conditions, is aware of energy conservation techniques Nutrition: Adequate nutrition will be maintained 11/12/2017 2103 - Progressing by Jasper RilingSarine, Yani Lal M, RN Note Encouraging pt although his appetite is only fair, today he said he didn't have much appetite Coping: Level of anxiety will decrease 11/12/2017 2103 - Progressing by Jasper RilingSarine, Aylanie Cubillos M, RN Elimination: Will not experience complications related to bowel motility 11/12/2017 2103 - Progressing by Jasper RilingSarine, Calvina Liptak M, RN Will not experience complications related to urinary retention 11/12/2017 2103 - Progressing by Jasper RilingSarine, Marielena Harvell M, RN Pain Managment: General experience of comfort will improve 11/12/2017 2103 - Progressing by Jasper RilingSarine, Idabelle Mcpeters M, RN Safety: Ability to remain free from injury will improve 11/12/2017 2103 - Progressing by Jasper RilingSarine, Michio Thier M, RN Skin Integrity: Risk for impaired skin integrity will decrease 11/12/2017 2103 - Progressing by Jasper RilingSarine, Julaine Zimny M,  RN Education: Ability to demonstrate management of disease process will improve 11/12/2017 2103 - Progressing by Jasper RilingSarine, Javeion Cannedy M, RN Ability to verbalize understanding of medication therapies will improve 11/12/2017 2103 - Progressing by Jasper RilingSarine, Abu Heavin M, RN Activity: Capacity to carry out activities will improve 11/12/2017 2103 - Progressing by Jasper RilingSarine, Jabaree Mercado M, RN Cardiac: Ability to achieve and maintain adequate cardiopulmonary perfusion will improve 11/12/2017 2103 - Progressing by Jasper RilingSarine, Bettymae Yott M, RN

## 2017-11-12 NOTE — Progress Notes (Signed)
Pt refusing meds this am. Pt agitated and states, "Call my sister and have her bring me my clothes and my shoes, I am going home!" Pt flipped back into fib/flutter HR up to 140's. Pt's sister called for patient to speak with her, and MD notified. MD verbal order to turn off dopamine at this time.   10:25 was able to get the patient to take his aspirin. Will continue to monitor.  Efraim KaufmannMelissa Roylene ReasonGarstka

## 2017-11-12 NOTE — Progress Notes (Signed)
Paged by bedside RN concerning pts HR and BP. His SBP in the 70's and 80's and his HR was in the 140's- 150's. A-fib was noted on his EKG. Carries a history of Acute on chronic combined systolic and diastolic congestive heart failure with an EF of 15-20%. He was placed on dopamine and milrinone by cardiology. History of paroxysmal A-fib on metoprolol IV PRN and heparin. Given pts fragile cardiology status mutliple attempts were made to contact [facility]. His HR was still maintaining in the 140's and the pt was still hypotensive. Unable to reach [facility] I contacted PCCM who recommended that the pt be placed on an Amiodarone drip.   During this time the pts respiratory status began to decline and his O2 demands increased from 3l [place] to 10Lvia [place]. A CXR was obtained revealing "Interval increase in size of the heart and progression of vascular congestion compared to the prior radiograph." He was requiring a NRB on 15L at this time to maintain an O2 sat >90%. Amiodarone was initiated and I spoke with Dr. Jacinto HalimGanji of [facility] who ordered that the Milrinone be dc'd, dopamine be maintained at 525mcg/kg, and a 250cc bolus be administered. He also ordered for the pt to be transferred to ICU.  Pt alert and oriented x4 so I discussed with him at length about code status. I explained what CPR was that his prognosis was not good and that he would most likely require being placed on ventilator. Pt stated that he understood and he would like to remain a "full code" until discussing this further with his family.   Assessment and Plan 1. Paroxysmal A-Fib- Pt placed on Amiodarone gtt. On IV Heparin. Continue to monitor HR.  2. Hypotension- Milrinone dc'd per cardiology. 250ml NS bolus given. [facility]  3. Acute respiratory failure- On NRB, CXR obtained. [facility].   Stevie Kernharles Siya Flurry AGPCNP-BC, NP-C [facility] Pager [phone]  CRITICAL CARE Performed by: Macon Largeharles A Delita Chiquito   Total critical care time: 90 minutes  Critical care time was exclusive of separately billable procedures and treating other patients.  Critical care was necessary to treat or prevent imminent or life-threatening deterioration.  Critical care was time spent personally by me on the following activities: development of treatment plan with patient and/or surrogate as well as nursing, discussions with consultants, evaluation of patient's response to treatment, examination of patient, obtaining history from patient or surrogate, ordering and performing treatments and interventions, ordering and review of laboratory studies, ordering and review of radiographic studies, pulse oximetry and re-evaluation of patient's condition.

## 2017-11-12 NOTE — Progress Notes (Signed)
[facility] at this time per [facility]

## 2017-11-12 NOTE — Progress Notes (Signed)
Name: [name] LernerBobby [name] MRN: [id number] DOB: 06/30/1933    ADMISSION DATE:  11/06/2017 CONSULTATION DATE:  11/12/2017  REFERRING MD :  11/12/2017  CHIEF COMPLAINT:  Dr. Ronalee BeltsBhandari  HISTORY OF PRESENT ILLNESS:   84 year old male with PMH of HTN, CAD s/p PCI with stent, CKD stage 3, Hypothyroidism, Tobacco Abuse,   Presents to ED on 1/13 with shortness of breath, confusion, s/p fall. Oxygen Saturation 84% RA, CXR with vascular congestion and bilateral small pleural effusion. BNP 2000.   Over course of stay, patient was diuresed (-2L). Cardiology consulted. Underwent Cath on 1/17. Patient not a candidate for surgical procedure. Started on Dopamine and Milrinone. Overnight 1/19 patient with progressive hypotension and rapid a.flutter. PCCM asked to consult.   SIGNIFICANT EVENTS  1/13 > Presents to ED  1/17 > Cardiac Cath/LP  11/12/2017 full DNR  STUDIES:  Serum RPR Antibody 1/14 > Positive  LP 1/17 > glucose 77, total protein 32, 340 rbcs, and 1 wbc Cardiac Cath 1/17 >>  90% stenosis in the proximal to mid LAD, a 90% stenosis in size ramus branch, and an occluded proximal right coronary artery with collaterals to the distal vessel from the LAD    SUBJECTIVE:  No acute distress.  Wants to go home. VITAL SIGNS: Temp:  [97.5 F (36.4 C)-98.3 F (36.8 C)] 97.6 F (36.4 C) (01/19 1226) Pulse Rate:  [49-154] 71 (01/19 1230) Resp:  [11-47] 18 (01/19 1230) BP: (65-126)/(41-98) 112/61 (01/19 1230) SpO2:  [66 %-100 %] 100 % (01/19 1230) Weight:  [64.7 kg (142 lb 10.2 oz)] 64.7 kg (142 lb 10.2 oz) (01/19 0500)  PHYSICAL EXAMINATION: General: Elderly male in no acute distress HEENT: No JVD noted PSY: Angry at times Neuro: Moves all extremities x4 CV: Currently in sinus rhythm ventricular rate of 80 dopamine is been turned off continued on amiodarone drip at 6.7 PULM: Coarse rhonchi bilaterally WU:JWJXGI:soft, non-tender, bsx4 active  Extremities: warm/dry, 1+ edema  Skin: no rashes or  lesions   Recent Labs  Lab 11/10/17 0305 11/11/17 0341 11/12/17 0358  NA 138 141 138  K 3.6 3.7 4.0  CL 97* 97* 98*  CO2 31 34* 31  BUN 30* 24* 24*  CREATININE 1.42* 1.37* 1.30*  GLUCOSE 115* 109* 135*   Recent Labs  Lab 11/10/17 0305 11/11/17 0341 11/12/17 0358  HGB 12.9* 13.0 12.5*  HCT 39.9 39.3 38.8*  WBC 7.7 8.6 8.0  PLT 139* 152 149*   Dg Chest Port 1 View  Result Date: 11/12/2017 CLINICAL DATA:  84 year old male with shortness of breath. EXAM: PORTABLE CHEST 1 VIEW COMPARISON:  Chest radiograph dated 11/07/2017 FINDINGS: There is cardiomegaly with overall increase in the size of the cardiopericardial silhouette compared to the prior radiograph. Vascular congestion also progressed. Patchy airspace density at the right lung base/infrahilar region may represent atelectasis or pneumonia. No pneumothorax. Degenerative changes of the shoulders. No acute osseous pathology. IMPRESSION: Interval increase in size of the heart and progression of vascular congestion compared to the prior radiograph. Right lung base airspace haziness, atelectasis versus infiltrate. Clinical correlation is recommended. Electronically Signed   By: Elgie CollardArash  Radparvar M.D.   On: 11/12/2017 03:02   Dg Fluoro Guide Lumbar Puncture  Result Date: 11/10/2017 CLINICAL DATA:  84 year old male with positive VDRL, confusion and abnormal gait. Diagnostic lumbar puncture requested. EXAM: DIAGNOSTIC LUMBAR PUNCTURE UNDER FLUOROSCOPIC GUIDANCE FLUOROSCOPY TIME:  Fluoroscopy Time:  0 min 48 sec Radiation Exposure Index (if provided by the fluoroscopic device): Number of Acquired Spot  Images: 4 COMPARISON:  Lumbar radiographs 06/08/2014. PROCEDURE: Informed consent was obtained from the patient prior to the procedure, including potential complications of headache, infection, and bleeding. A "time-out" was performed. With the patient prone, the lower back was prepped with Betadine. 1% Lidocaine was used for local anesthesia.  Lower lumbar spondylolisthesis noted on the 2015 comparison. Several fluoroscopic guided LP attempts were made at both the L2-L3, and L1-L2 levels with the needle encountering bone each time. Finally, using sub laminar technique the needle was able to be advanced through the lamina at L1-L2 level, and lumbar puncture was performed at that level using a 3.5 in x 20 gauge needle with return of initially bloody but ultimately clear, colorless CSF. 16 mL of CSF were obtained for laboratory studies. The patient tolerated the procedure well. Minimal bleeding was encountered, direct pressure was held and hemostasis noted. There were no apparent complications. Appropriate post procedural orders were placed on the chart. The patient was returned to the inpatient floor in stable condition for continued treatment. IMPRESSION: Fluoroscopic guided lumbar puncture at L1-L2. CSF laboratory analysis pending. Electronically Signed   By: Odessa Fleming M.D.   On: 11/10/2017 16:10    ASSESSMENT / PLAN:  Acute Hypoxic Respiratory Failure in setting of decompensated diastolic heart failure  ABG 7.5/42.1/54.9 CXR with increase in size of heart and progression of vascular congestion. Right lung base airspace haziness, atelectasias vs infiltrate  Plan  -Wean Supplemental Oxygen to Maintain Saturation >90  -Trend CXR/ABG -Continue O2 as needed consider transfer to stepdown unit  Hypotension in setting of Rapid A. Flutter Acute on Chronic Diastolic HF (EF 15-20) H/O HTN, CAD s/p PCI with stents, HLD  Plan  -Cardiac Monitoring  -Cardiology Following  -Amiodarone gtt ordered and in place -Dig per cards -Maintain MAP >65 (Decrease rate of dopamine, currently on 10 mcg, order set for 3)  -Convert heparin to LMWH per cards -Continue Lipitor and ASA -Off dopamine and placed on digitalis per cardiology -D/C Bidil, milrinone, lasix secondary to hypotension  -Full DNR per cardiology on 11/12/1998  CKD stage 3  Lab Results   Component Value Date   CREATININE 1.30 (H) 11/12/2017   CREATININE 1.37 (H) 11/11/2017   CREATININE 1.42 (H) 11/10/2017   Recent Labs  Lab 11/10/17 0305 11/11/17 0341 11/12/17 0358  K 3.6 3.7 4.0    Plan -Trend BMP -Replace electrolytes as indicated   RPR and T. Pallidum antibody positive  -CSF VDRL > Negative  Plan -ID signed off.  -receiving Bicillin x 3 doses   Brett Canales  ACNP [facility] PCCM Pager [phone] till 1 pm If no answer page [phone] 11/12/2017, 1:23 PM

## 2017-11-12 NOTE — Significant Event (Signed)
Rapid Response Event Note  Overview: called about patient being hypotensive and HR in 140-150s ( AF, ST, Afib, etc) and in respiratory distress  Initial Focused Assessment: When I arrived, HR was in the 140s, SBP anywhere from 80-100s ( LLFA), RR in the upper teens, not in distress, neuro intact (periods of confusion but can be reoriented). Lung sounds clear to diminished.  CCM team was at bedside as well.  Skin cool touch but + pulses.  Low sats were reported as well.  Patient was on 0.5425mcg Milrinone, 10mcg of Dopamine, and heparin drip.   Interventions: -- ABG was done prior to my arrival (low PaO2), placed on NRB 15L -- CXR was done prior to my arrival -- AMIO ordered, dopamine was stopped briefly but needed to be restarted at 2.415mcg for hypotension.  -- Patient did became more restless and anxious, 0.5mg  IV Ativan given, patient was more calm and safely transferred to CVICU.  Plan of Care (if not transferred): -- After multiple attempts to reach CARDS on call, decision made to stop milrinone, give 500cc NS bolus, keep dopamine at 405mcg,and transfer patient to ICU.   Event Summary:  End Time 530  [place] Markey R

## 2017-11-12 NOTE — Progress Notes (Signed)
Patient agreed to take his potassium.

## 2017-11-12 NOTE — Consult Note (Signed)
Name: [name] [name] MRN: [id number] DOB: May 08, 1933    ADMISSION DATE:  11/06/2017 CONSULTATION DATE:  11/12/2017  REFERRING MD :  11/12/2017  CHIEF COMPLAINT:  Dr. Ronalee Belts  HISTORY OF PRESENT ILLNESS:   84 year old male with PMH of HTN, CAD s/p PCI with stent, CKD stage 3, Hypothyroidism, Tobacco Abuse,   Presents to ED on 1/13 with shortness of breath, confusion, s/p fall. Oxygen Saturation 84% RA, CXR with vascular congestion and bilateral small pleural effusion. BNP 2000.   Over course of stay, patient was diuresed (-2L). Cardiology consulted. Underwent Cath on 1/17. Patient not a candidate for surgical procedure. Started on Dopamine and Milrinone. Overnight 1/19 patient with progressive hypotension and rapid a.flutter. PCCM asked to consult.   SIGNIFICANT EVENTS  1/13 > Presents to ED  1/17 > Cardiac Cath/LP   STUDIES:  Serum RPR Antibody 1/14 > Positive  LP 1/17 > glucose 77, total protein 32, 340 rbcs, and 1 wbc Cardiac Cath 1/17 >>  90% stenosis in the proximal to mid LAD, a 90% stenosis in size ramus branch, and an occluded proximal right coronary artery with collaterals to the distal vessel from the LAD  PAST MEDICAL HISTORY :   has a past medical history of CHF (congestive heart failure) (HCC), CKD (chronic kidney disease), stage II, Heart attack (HCC), and Hypertension.  has a past surgical history that includes Coronary stent placement and RIGHT/LEFT HEART CATH AND CORONARY ANGIOGRAPHY (N/A, 11/10/2017). Prior to Admission medications   Medication Sig Start Date End Date Taking? Authorizing Provider  albuterol (PROVENTIL HFA;VENTOLIN HFA) 108 (90 Base) MCG/ACT inhaler Inhale 2 puffs into the lungs every 4 (four) hours as needed for wheezing or shortness of breath. 01/02/17  Yes Cathren Laine, MD  furosemide (LASIX) 20 MG tablet Take 20 mg by mouth daily. 11/02/17  Yes [provider]  metoprolol tartrate (LOPRESSOR) 25 MG tablet Take 25 mg by mouth 2 (two) times  daily. 11/02/17  Yes [provider]  potassium chloride (K-DUR) 10 MEQ tablet Take 10 mEq by mouth 2 (two) times daily. 11/02/17  Yes [provider]  budesonide-formoterol (SYMBICORT) 160-4.5 MCG/ACT inhaler Inhale 2 puffs into the lungs 2 (two) times daily. Patient not taking: Reported on 11/06/2017 01/28/17 04/13/17  Chilton Greathouse, MD   No Known Allergies  FAMILY HISTORY:  family history includes Aneurysm in his mother; Heart failure in his brother. SOCIAL HISTORY:  reports that he has been smoking.  He has a 35.00 pack-year smoking history. he has never used smokeless tobacco. He reports that he does not drink alcohol or use drugs.  REVIEW OF SYSTEMS:   All negative; except for those that are bolded, which indicate positives.  Constitutional: weight loss, weight gain, night sweats, fevers, chills, fatigue, weakness.  HEENT: headaches, sore throat, sneezing, nasal congestion, post nasal drip, difficulty swallowing, tooth/dental problems, visual complaints, visual changes, ear aches. Neuro: difficulty with speech, weakness, numbness, ataxia. CV:  chest pain, orthopnea, PND, swelling in lower extremities, dizziness, palpitations, syncope.  Resp: cough, hemoptysis, dyspnea, wheezing. GI: heartburn, indigestion, abdominal pain, nausea, vomiting, diarrhea, constipation, change in bowel habits, loss of appetite, hematemesis, melena, hematochezia.  GU: dysuria, change in color of urine, urgency or frequency, flank pain, hematuria. MSK: joint pain or swelling, decreased range of motion. Psych: change in mood or affect, depression, anxiety, suicidal ideations, homicidal ideations. Skin: rash, itching, bruising.  SUBJECTIVE:   VITAL SIGNS: Temp:  [98.1 F (36.7 C)-98.3 F (36.8 C)] 98.3 F (36.8 C) (01/19  0005) Pulse Rate:  [43-151] 151 (01/19 0330) Resp:  [11-42] 23 (01/19 0330) BP: (65-126)/(43-90) 125/90 (01/19 0330) SpO2:  [78 %-100 %] 98 % (01/19 0330) Weight:  [63.2  kg (139 lb 5.3 oz)] 63.2 kg (139 lb 5.3 oz) (01/18 0600)  PHYSICAL EXAMINATION: General:  Elderly male, no distress  Neuro:  Alert, oriented, follows commands  HEENT:  Dry MM  Cardiovascular:  Irregular, no MRG  Lungs:  Clear breath sounds, no wheeze  Abdomen:  Non-distended, active bowl sounds  Musculoskeletal:  -edema  Skin:  Warm, dry, intact   Recent Labs  Lab 11/09/17 0257 11/09/17 1813 11/10/17 0305 11/11/17 0341  NA 140  --  138 141  K 3.8  --  3.6 3.7  CL 97*  --  97* 97*  CO2 32  --  31 34*  BUN 37*  --  30* 24*  CREATININE 1.81* 1.62* 1.42* 1.37*  GLUCOSE 116*  --  115* 109*   Recent Labs  Lab 11/09/17 0257 11/10/17 0305 11/11/17 0341  HGB 12.7* 12.9* 13.0  HCT 39.1 39.9 39.3  WBC 8.8 7.7 8.6  PLT 130* 139* 152   Dg Chest Port 1 View  Result Date: 11/12/2017 CLINICAL DATA:  84 year old male with shortness of breath. EXAM: PORTABLE CHEST 1 VIEW COMPARISON:  Chest radiograph dated 11/07/2017 FINDINGS: There is cardiomegaly with overall increase in the size of the cardiopericardial silhouette compared to the prior radiograph. Vascular congestion also progressed. Patchy airspace density at the right lung base/infrahilar region may represent atelectasis or pneumonia. No pneumothorax. Degenerative changes of the shoulders. No acute osseous pathology. IMPRESSION: Interval increase in size of the heart and progression of vascular congestion compared to the prior radiograph. Right lung base airspace haziness, atelectasis versus infiltrate. Clinical correlation is recommended. Electronically Signed   By: Elgie Collard M.D.   On: 11/12/2017 03:02   Dg Fluoro Guide Lumbar Puncture  Result Date: 11/10/2017 CLINICAL DATA:  84 year old male with positive VDRL, confusion and abnormal gait. Diagnostic lumbar puncture requested. EXAM: DIAGNOSTIC LUMBAR PUNCTURE UNDER FLUOROSCOPIC GUIDANCE FLUOROSCOPY TIME:  Fluoroscopy Time:  0 min 48 sec Radiation Exposure Index (if provided by  the fluoroscopic device): Number of Acquired Spot Images: 4 COMPARISON:  Lumbar radiographs 06/08/2014. PROCEDURE: Informed consent was obtained from the patient prior to the procedure, including potential complications of headache, infection, and bleeding. A "time-out" was performed. With the patient prone, the lower back was prepped with Betadine. 1% Lidocaine was used for local anesthesia. Lower lumbar spondylolisthesis noted on the 2015 comparison. Several fluoroscopic guided LP attempts were made at both the L2-L3, and L1-L2 levels with the needle encountering bone each time. Finally, using sub laminar technique the needle was able to be advanced through the lamina at L1-L2 level, and lumbar puncture was performed at that level using a 3.5 in x 20 gauge needle with return of initially bloody but ultimately clear, colorless CSF. 16 mL of CSF were obtained for laboratory studies. The patient tolerated the procedure well. Minimal bleeding was encountered, direct pressure was held and hemostasis noted. There were no apparent complications. Appropriate post procedural orders were placed on the chart. The patient was returned to the inpatient floor in stable condition for continued treatment. IMPRESSION: Fluoroscopic guided lumbar puncture at L1-L2. CSF laboratory analysis pending. Electronically Signed   By: Odessa Fleming M.D.   On: 11/10/2017 16:10    ASSESSMENT / PLAN:  Acute Hypoxic Respiratory Failure in setting of decompensated diastolic heart failure  ABG 7.5/42.1/54.9  CXR with increase in size of heart and progression of vascular congestion. Right lung base airspace haziness, atelectasias vs infiltrate  Plan  -Wean Supplemental Oxygen to Maintain Saturation >90  -Trend CXR/ABG -BiPAP PRN   Hypotension in setting of Rapid A. Flutter Acute on Chronic Diastolic HF (EF 15-20) H/O HTN, CAD s/p PCI with stents, HLD  Plan  -Cardiac Monitoring  -Cardiology Following  -Amiodarone gtt ordered  -Maintain  MAP >65 (Decrease rate of dopamine, currently on 10 mcg, order set for 3)  -Continue Heparin gtt -Continue Lipitor and ASA -Further management of dopamine per cardiology (currently on 5 mcg)  -D/C Bidil, milrinone, lasix secondary to hypotension   CKD stage 3  Plan -Trend BMP -Replace electrolytes as indicated   RPR and T. Pallidum antibody positive  -CSF VDRL > Negative  Plan -ID signed off.  -receiving Bicillin x 3 doses   Jovita KussmaulKatalina Yaseen Gilberg, AGACNP-BC [facility]  Pgr: [phone]  PCCM Pgr: [phone]

## 2017-11-12 NOTE — Progress Notes (Signed)
ANTICOAGULATION CONSULT NOTE - Follow Up Consult  Pharmacy Consult for Heparin  Indication: chest pain/ACS and atrial fibrillation, s/p cath, s/p LP for encephalopathy   No Known Allergies  Patient Measurements: Height: 5\' 6"  (167.6 cm) Weight: 142 lb 10.2 oz (64.7 kg) IBW/kg (Calculated) : 63.8  Vital Signs: Temp: 97.9 F (36.6 C) (01/19 0833) Temp Source: Axillary (01/19 0833) BP: 93/66 (01/19 1030) Pulse Rate: 75 (01/19 1030)  Labs: Recent Labs    11/10/17 0305 11/11/17 0341 11/11/17 1204 11/12/17 0358  HGB 12.9* 13.0  --  12.5*  HCT 39.9 39.3  --  38.8*  PLT 139* 152  --  149*  HEPARINUNFRC  --  0.22* 0.35 0.37  CREATININE 1.42* 1.37*  --  1.30*    Estimated Creatinine Clearance: 39.5 mL/min (A) (by C-G formula based on SCr of 1.3 mg/dL (H)).   Assessment: 84 y/o M on heparin for afib, also s/p cath with multi-vessel disease, not a CABG candidate, will likely need PCI, also s/p LP for encephalopathy, heparin resumed yesterday 4 hours s/p LP.   Heparin level this morning is therapeutic at 0.3, on 1050 units/hr. CBC is stable. No signs/symptoms of bleeding. No infusion issues. New orders to change to lovenox to reduce extra fluid intake.   Afib with rvr overnight, started on amiodarone this morning. Will also add digoxin. 0.5mg  given by cardiology this am, given age 84 and crcl in 30s will not given any additional digoxin and start maintenance dose of 0.16425mcg in the morning. Amio can also cause accumulation of digoxin so will watch closely.   Goal of Therapy:  Anti-Xa level 0.6-1 units/ml 4hrs after LMWH dose given Monitor platelets by anticoagulation protocol: Yes   Plan:  Change heparin to lovenox 60 mg q12 hours CBC in am then q72 hours Monitor for signs/symptoms of bleeding Patient loaded with digoxin 0.5mg  this am by md, continue with 0.14525mcg daily starting 1/20 Check digoxin level as indicated  Sheppard CoilFrank Wilson PharmD., BCPS Clinical Pharmacist 11/12/2017  10:47 AM

## 2017-11-12 NOTE — Progress Notes (Signed)
Pt HR remains 140's Aflutter despite PRN metoprolol given with intermittent runs of Sinus rhythm noted.  BP decreased to 77/58. Dopamine increased to 7.5 mcg/kg/min.  MD paged.  Awaiting response.  Pt A&Ox4.  Tachypneic w/ RR 30's but pt states "I feel the same and my breathing is fine."  Pt able to speak full sentences with no use of accessory muscles noted.  Lungs clear to auscultation.

## 2017-11-12 NOTE — Progress Notes (Signed)
Dr. Jacinto HalimGanji called with verbal order for 40 mg lasix Q12 H starting now. Will administer and continue to monitor.  Delories HeinzMelissa Taneasha Fuqua, RN

## 2017-11-12 NOTE — Progress Notes (Signed)
Rounded on patient and noticed pt bleeding from his lumbar puncture site. Pressure held for 10 minutes. Pressure dressing applied. Will have patient lay on his back. MD Mannum made aware. Patient bathed and sheets changed. Will continue to monitor closely.   Delories HeinzMelissa Christropher Gintz, RN

## 2017-11-12 NOTE — Progress Notes (Signed)
BP increased with increase in dopamine, HR remains elevated 140's-160's.  Awaiting return call from MD.

## 2017-11-13 ENCOUNTER — Inpatient Hospital Stay (HOSPITAL_COMMUNITY): Payer: Medicare HMO

## 2017-11-13 LAB — RENAL FUNCTION PANEL
ALBUMIN: 2.9 g/dL — AB (ref 3.5–5.0)
Anion gap: 12 (ref 5–15)
BUN: 35 mg/dL — ABNORMAL HIGH (ref 6–20)
CALCIUM: 8.5 mg/dL — AB (ref 8.9–10.3)
CO2: 31 mmol/L (ref 22–32)
CREATININE: 1.44 mg/dL — AB (ref 0.61–1.24)
Chloride: 97 mmol/L — ABNORMAL LOW (ref 101–111)
GFR calc Af Amer: 51 mL/min — ABNORMAL LOW (ref >60)
GFR, EST NON AFRICAN AMERICAN: 44 mL/min — AB (ref >60)
GLUCOSE: 97 mg/dL (ref 65–99)
POTASSIUM: 4.4 mmol/L (ref 3.5–5.1)
Phosphorus: 2.1 mg/dL — ABNORMAL LOW (ref 2.5–4.6)
Sodium: 140 mmol/L (ref 135–145)

## 2017-11-13 LAB — CBC WITH DIFFERENTIAL/PLATELET
Basophils Absolute: 0 10*3/uL (ref 0.0–0.1)
Basophils Relative: 0 %
Eosinophils Absolute: 0.1 10*3/uL (ref 0.0–0.7)
Eosinophils Relative: 1 %
HEMATOCRIT: 42.1 % (ref 39.0–52.0)
HEMOGLOBIN: 13.8 g/dL (ref 13.0–17.0)
LYMPHS ABS: 1.7 10*3/uL (ref 0.7–4.0)
Lymphocytes Relative: 23 %
MCH: 32 pg (ref 26.0–34.0)
MCHC: 32.8 g/dL (ref 30.0–36.0)
MCV: 97.7 fL (ref 78.0–100.0)
MONO ABS: 0.7 10*3/uL (ref 0.1–1.0)
MONOS PCT: 9 %
NEUTROS ABS: 5 10*3/uL (ref 1.7–7.7)
NEUTROS PCT: 67 %
Platelets: 151 10*3/uL (ref 150–400)
RBC: 4.31 MIL/uL (ref 4.22–5.81)
RDW: 15.5 % (ref 11.5–15.5)
WBC: 7.6 10*3/uL (ref 4.0–10.5)

## 2017-11-13 LAB — OCCULT BLOOD X 1 CARD TO LAB, STOOL: Fecal Occult Bld: POSITIVE — AB

## 2017-11-13 LAB — GLUCOSE, CAPILLARY: Glucose-Capillary: 83 mg/dL (ref 65–99)

## 2017-11-13 LAB — MAGNESIUM: Magnesium: 1.8 mg/dL (ref 1.7–2.4)

## 2017-11-13 MED ORDER — MAGNESIUM SULFATE 2 GM/50ML IV SOLN
2.0000 g | Freq: Once | INTRAVENOUS | Status: AC
  Administered 2017-11-13: 2 g via INTRAVENOUS
  Filled 2017-11-13: qty 50

## 2017-11-13 MED ORDER — AMIODARONE HCL 200 MG PO TABS
400.0000 mg | ORAL_TABLET | Freq: Two times a day (BID) | ORAL | Status: DC
  Administered 2017-11-13 – 2017-11-15 (×5): 400 mg via ORAL
  Filled 2017-11-13 (×5): qty 2

## 2017-11-13 MED ORDER — HYDRALAZINE HCL 25 MG PO TABS
25.0000 mg | ORAL_TABLET | Freq: Three times a day (TID) | ORAL | Status: DC
  Administered 2017-11-13 – 2017-11-15 (×5): 25 mg via ORAL
  Filled 2017-11-13 (×5): qty 1

## 2017-11-13 NOTE — Progress Notes (Signed)
MD verbal order to hold Lovenox for bleeding at lumbar puncture site. 2g Mag IV ordered for replacement. Will continue to monitor  Delories HeinzMelissa Luverta Korte, RN

## 2017-11-13 NOTE — Progress Notes (Signed)
Report called to [address].

## 2017-11-13 NOTE — Progress Notes (Signed)
Subjective:  Has maintained sinus. Has started to feel the best and no desaturation. Still gets tachypneic when he moves around in bed. No fever or chills. Family at bedside. Had significant bleeding at the LP site and lovenox held. Also had dark stools and guaic sent.  Objective:  Vital Signs in the last 24 hours: Temp:  [97.3 F (36.3 C)-97.8 F (36.6 C)] 97.3 F (36.3 C) (01/20 1657) Pulse Rate:  [51-88] 51 (01/20 1657) Resp:  [17-40] 18 (01/20 1657) BP: (98-133)/(54-80) 133/70 (01/20 1657) SpO2:  [85 %-100 %] 96 % (01/20 1712) Weight:  [59.9 kg (132 lb)-64.7 kg (142 lb 10.2 oz)] 59.9 kg (132 lb) (01/20 1657)  Intake/Output from previous day: 01/19 0701 - 01/20 0700 In: 754.8 [P.O.:125; I.V.:629.8] Out: 2600 [Urine:2600]  Physical Exam:  General appearance: alert, cooperative, appears older than stated age, mild distress and increased work of breathing. Does not appear to be in acute distress. Ill looking. Eyes: negative findings: lids and lashes normal Neck: JVD to the angle of the jaw.  Neck: JVP - normal, carotids 2+= without bruits Resp: Barrel-shaped chest.  Decreased air entry bilateral, coarse crackles bilateral bases, occasional expiratory wheeze. Chest wall: no tenderness Cardio: regular rate and rhythm, S1, S2 normal, no murmur, click, rub or gallop and Distant heart sounds.  S1 and S2 is normal, no gallop or murmur appreciated.  Frequent ectopy present GI: soft, non-tender; bowel sounds normal; no masses,  no organomegaly and Liver is just felt below the right costal margin.  Mildly tender. Extremities: extremities normal, atraumatic, no cyanosis or edema    Lab Results: BMP Recent Labs    11/11/17 0341 11/12/17 0358 11/13/17 0449  NA 141 138 140  K 3.7 4.0 4.4  CL 97* 98* 97*  CO2 34* 31 31  GLUCOSE 109* 135* 97  BUN 24* 24* 35*  CREATININE 1.37* 1.30* 1.44*  CALCIUM 8.1* 8.0* 8.5*  GFRNONAA 46* 49* 44*  GFRAA 54* 57* 51*    CBC Recent Labs  Lab  11/13/17 0449  WBC 7.6  RBC 4.31  HGB 13.8  HCT 42.1  PLT 151  MCV 97.7  MCH 32.0  MCHC 32.8  RDW 15.5  LYMPHSABS 1.7  MONOABS 0.7  EOSABS 0.1  BASOSABS 0.0    HEMOGLOBIN A1C Lab Results  Component Value Date   HGBA1C 6.3 (H) 11/07/2017   MPG 134.11 11/07/2017    Cardiac Panel (last 3 results) Recent Labs    11/06/17 1935 11/07/17 0152 11/07/17 0731  TROPONINI 0.29* 0.44* 0.37*   TSH Recent Labs    11/07/17 0152  TSH 1.340    Lipid Panel     Component Value Date/Time   CHOL 149 11/07/2017 0201   TRIG 64 11/07/2017 0201   HDL 42 11/07/2017 0201   CHOLHDL 3.5 11/07/2017 0201   VLDL 13 11/07/2017 0201   LDLCALC 94 11/07/2017 0201     Hepatic Function Panel Recent Labs    04/13/17 0028 11/06/17 1651 11/09/17 0257 11/12/17 0358 11/13/17 0449  PROT 7.8 6.0* 4.8*  --   --   ALBUMIN 4.2 3.6 2.8* 2.7* 2.9*  AST 22 78* 53*  --   --   ALT 10* 59 41  --   --   ALKPHOS 99 110 101  --   --   BILITOT 0.4 1.1 0.7  --   --     Imaging: Dg Chest Port 1 View  Result Date: 11/13/2017 CLINICAL DATA:  Respiratory failure EXAM: PORTABLE CHEST  1 VIEW COMPARISON:  November 12, 2017 FINDINGS: There is slight atelectasis in the right base. There is no edema or consolidation. There is cardiomegaly with pulmonary vascularity within normal limits. There is aortic atherosclerosis. No evident bone lesions. IMPRESSION: Cardiomegaly without appreciable edema or consolidation. Mild right base atelectasis. There is aortic atherosclerosis. Aortic Atherosclerosis (ICD10-I70.0). Electronically Signed   By: Bretta BangWilliam  Woodruff III M.D.   On: 11/13/2017 07:38   Dg Chest Port 1 View  Result Date: 11/12/2017 CLINICAL DATA:  84 year old male with shortness of breath. EXAM: PORTABLE CHEST 1 VIEW COMPARISON:  Chest radiograph dated 11/07/2017 FINDINGS: There is cardiomegaly with overall increase in the size of the cardiopericardial silhouette compared to the prior radiograph. Vascular  congestion also progressed. Patchy airspace density at the right lung base/infrahilar region may represent atelectasis or pneumonia. No pneumothorax. Degenerative changes of the shoulders. No acute osseous pathology. IMPRESSION: Interval increase in size of the heart and progression of vascular congestion compared to the prior radiograph. Right lung base airspace haziness, atelectasis versus infiltrate. Clinical correlation is recommended. Electronically Signed   By: Elgie CollardArash  Radparvar M.D.   On: 11/12/2017 03:02    Cardiac Studies:  EKG: EKG 11/12/2017: April fibrillation with rapid ventricular response, nonspecific ST-T abnormality.  Telemetry: Normal sinus rhythm, frequent PVCs at 8:45 AM on 11/12/2017.  ECHO: Left ventricle: The cavity size was mildly dilated. The estimated   ejection fraction was in the range of 15% to 20%. Severe diffuse   global hypokinesis. Grade II diastolic dysfunction. Elevated LAP. - Aortic valve: There was moderate regurgitation. - Mitral valve: Mildly dilated annulus. There was mild to moderate   regurgitation. - Left atrium: The atrium was moderately dilated. - Right ventricle: The cavity size was mildly dilated. Systolic   function was moderately reduced. - Right atrium: The atrium was mildly dilated. - Tricuspid valve: Mildly dilated annulus. There was   moderate-severe regurgitation. - Pulmonary arteries: PA peak pressure: 46 mm Hg (S). May be  underestimated due to elevated RA pressure - Pericardium, extracardiac: There was a left pleural effusion.  Coronary angiogram 11/10/2017:  Severe multivessel coronary artery disease LM: Normal; LAD/Diag bifurcation 90% stenosis (1,1,0); Medium sized ramus mid 90% stenosis LCx prox 40% Grade 1 to 2 left-to-right collaterals Prox RCA 100% Elevated LVEDP. Severe biventricular failure  Scheduled Meds: . amiodarone  400 mg Oral BID  . aspirin  324 mg Oral Daily  . atorvastatin  40 mg Oral q1800  . digoxin  0.125 mg Oral  Daily  . enoxaparin (LOVENOX) injection  60 mg Subcutaneous BID  . furosemide  40 mg Intravenous Q12H  . mupirocin cream   Topical Daily  . nicotine  21 mg Transdermal Daily  . penicillin g benzathine (BICILLIN-LA) IM  2.4 Million Units Intramuscular Weekly  . potassium chloride  40 mEq Oral Daily  . sodium chloride flush  3 mL Intravenous Q12H   Continuous Infusions: . sodium chloride 250 mL (11/12/17 2000)   PRN Meds:.sodium chloride, acetaminophen, dextromethorphan-guaiFENesin, hydrALAZINE, ipratropium-albuterol, metoprolol tartrate, morphine injection, nitroGLYCERIN, ondansetron (ZOFRAN) IV, sodium chloride flush   Assessment/Plan:  1.  Acute on chronic systolic and diastolic heart failure with biventricular failure 2.  Acute on chronic cor pulmonale secondary to underlying severe COPD and emphysema. 3.  Multivessel coronary artery disease, not felt to be a surgical candidate. 4.  Chronic stage III kidney disease, serum creatinine is stable. 5.  Probable aspiration pneumonia versus pneumonia right base, new chest x-ray finding.  Worsening heart failure on  chest x-ray. 6. Bleeding at the LP site now stable and off lovenox. 7. Guaic positive stool. H/H stable 8. Paroxysmal A. Fib on Amiodarone and maintains sinus. CHA2DS2-VASCScore: Risk Score  4,  Yearly risk of stroke  4.   Recommendation: Extremely difficult situation. He has diuresed well and BP is stable. Will add Hydralazine 25 mg TID.  Lovenox on hold due to LP site bleed and now has dark stools with guaic positive stools.  Will switch to DVT rophylaxis dose tomorrow. Hold Lovenox for now. Yates Decamp, M.D. 11/13/2017, 7:04 PM [facility] Pager: [phone] Office: [phone] If no answer: [phone]

## 2017-11-13 NOTE — Progress Notes (Signed)
Name: [name] [name] MRN: [id number] DOB: 11/24/1932    ADMISSION DATE:  11/06/2017 CONSULTATION DATE:  11/12/2017  REFERRING MD :  11/12/2017  CHIEF COMPLAINT:  Dr. Ronalee BeltsBhandari  HISTORY OF PRESENT ILLNESS:   84 year old male with PMH of HTN, CAD s/p PCI with stent, CKD stage 3, Hypothyroidism, Tobacco Abuse,   Presents to ED on 1/13 with shortness of breath, confusion, s/p fall. Oxygen Saturation 84% RA, CXR with vascular congestion and bilateral small pleural effusion. BNP 2000.   Over course of stay, patient was diuresed (-2L). Cardiology consulted. Underwent Cath on 1/17. Patient not a candidate for surgical procedure. Started on Dopamine and Milrinone. Overnight 1/19 patient with progressive hypotension and rapid a.flutter. PCCM asked to consult.   SIGNIFICANT EVENTS  1/13 > Presents to ED  1/17 > Cardiac Cath/LP  1/19 > full DNR  STUDIES:  Serum RPR Antibody 1/14 > Positive  LP 1/17 > glucose 77, total protein 32, 340 rbcs, and 1 wbc Cardiac Cath 1/17 >>  90% stenosis in the proximal to mid LAD, a 90% stenosis in size ramus branch, and an occluded proximal right coronary artery with collaterals to the distal vessel from the LAD    SUBJECTIVE:  No acute distress.  Alert to place and person, but not year.  No complaints of CP or SOB.  No back pain.  RN noted bleeding at LP site and held Lovenox.  VITAL SIGNS: Temp:  [97.4 F (36.3 C)-97.8 F (36.6 C)] 97.7 F (36.5 C) (01/20 0300) Pulse Rate:  [64-95] 84 (01/20 0930) Resp:  [12-43] 21 (01/20 0900) BP: (81-150)/(52-92) 114/68 (01/20 0900) SpO2:  [87 %-100 %] 90 % (01/20 0900) Weight:  [142 lb 10.2 oz (64.7 kg)] 142 lb 10.2 oz (64.7 kg) (01/20 0600)  PHYSICAL EXAMINATION: General: resting in bed, NAD HEENT: NCAT, EOMI, no scleral icterus Cardiac: RRR, no rubs, murmurs or gallops Pulm: clear to auscultation bilaterally, diminished breath sounds, moving normal volumes of air Abd: soft, nontender, nondistended, BS  present Ext: warm and well perfused, no pedal edema Neuro: alert and oriented to person and place but not to year, cranial nerves II-XII grossly intact    Recent Labs  Lab 11/11/17 0341 11/12/17 0358 11/13/17 0449  NA 141 138 140  K 3.7 4.0 4.4  CL 97* 98* 97*  CO2 34* 31 31  BUN 24* 24* 35*  CREATININE 1.37* 1.30* 1.44*  GLUCOSE 109* 135* 97   Recent Labs  Lab 11/11/17 0341 11/12/17 0358 11/13/17 0449  HGB 13.0 12.5* 13.8  HCT 39.3 38.8* 42.1  WBC 8.6 8.0 7.6  PLT 152 149* 151   Dg Chest Port 1 View  Result Date: 11/13/2017 CLINICAL DATA:  Respiratory failure EXAM: PORTABLE CHEST 1 VIEW COMPARISON:  November 12, 2017 FINDINGS: There is slight atelectasis in the right base. There is no edema or consolidation. There is cardiomegaly with pulmonary vascularity within normal limits. There is aortic atherosclerosis. No evident bone lesions. IMPRESSION: Cardiomegaly without appreciable edema or consolidation. Mild right base atelectasis. There is aortic atherosclerosis. Aortic Atherosclerosis (ICD10-I70.0). Electronically Signed   By: Bretta BangWilliam  Woodruff III M.D.   On: 11/13/2017 07:38   Dg Chest Port 1 View  Result Date: 11/12/2017 CLINICAL DATA:  84 year old male with shortness of breath. EXAM: PORTABLE CHEST 1 VIEW COMPARISON:  Chest radiograph dated 11/07/2017 FINDINGS: There is cardiomegaly with overall increase in the size of the cardiopericardial silhouette compared to the prior radiograph. Vascular congestion also progressed. Patchy airspace density at  the right lung base/infrahilar region may represent atelectasis or pneumonia. No pneumothorax. Degenerative changes of the shoulders. No acute osseous pathology. IMPRESSION: Interval increase in size of the heart and progression of vascular congestion compared to the prior radiograph. Right lung base airspace haziness, atelectasis versus infiltrate. Clinical correlation is recommended. Electronically Signed   By: Elgie Collard M.D.    On: 11/12/2017 03:02    ASSESSMENT / PLAN:  Acute Hypoxic Respiratory Failure in setting of decompensated heart failure, atrial fib with RVR COPD Plan  -Wean Supplemental Oxygen to Maintain Saturation >90  -Continue O2 as needed -Duonebs PRN -Consider transfer to stepdown unit out of ICU.  Will d/w attending prior to place transfer orders  Hypotension in setting of A-fib with RVR Acute on CHF (EF 15-20%) H/O HTN, CAD s/p PCI with stents, HLD  Plan  -Cardiac Monitoring  -Dr Jacinto Halim of cardiology service following  -Continue amiodarone and digoxin per Cardiology -Maintain MAP >65 -Lovenox for anticoagulation.  Held this morning d/t bleeding from LP site -Continue Lipitor and ASA -Management of BB, Bidil, Lasix per cardiology -Full DNR per cardiology on 11/12/2017  CKD stage 3  Lab Results  Component Value Date   CREATININE 1.44 (H) 11/13/2017   CREATININE 1.30 (H) 11/12/2017   CREATININE 1.37 (H) 11/11/2017   Recent Labs  Lab 11/11/17 0341 11/12/17 0358 11/13/17 0449  K 3.7 4.0 4.4    Plan -Trend BMP -Replace electrolytes as indicated   RPR and T. Pallidum antibody positive  Latent Syphilis  -CSF VDRL > Negative  Plan -ID signed off.  -Needs 3 doses of Bicillin per ID.  First dose 1/18.  Can receive f/u doses if discharged at health department if does not have PCP.  Gwynn Burly, PGY3  [phone] 11/13/2017, 10:26 AM

## 2017-11-13 NOTE — Progress Notes (Signed)
Upon assessment this RN noticed that pt's bed with blood on the sheets. Pt's lumbar dressing saturated with blood. Dressing removed continuous trickle of blood. Manual pressure held times 10 min. Pressure dressing applied, pt bathed, and bed changed. MD notified. Will continue to monitor.  Delories HeinzMelissa Whitley Strycharz, RN

## 2017-11-13 NOTE — Progress Notes (Signed)
New pt transferred from 2 H. Pt brought to the floor in stable condition. Vitals taken. Initial Assessment done. All immediate pertinent needs to patient addressed. Patient Guide given to patient. Important safety instructions relating to hospitalization reviewed with patient. Patient verbalized understanding. Will continue to monitor pt, asked secretary to order continuous pulse oximeter  Lonia Farberekha, RN

## 2017-11-14 DIAGNOSIS — N183 Chronic kidney disease, stage 3 unspecified: Secondary | ICD-10-CM

## 2017-11-14 DIAGNOSIS — A530 Latent syphilis, unspecified as early or late: Secondary | ICD-10-CM

## 2017-11-14 DIAGNOSIS — I5041 Acute combined systolic (congestive) and diastolic (congestive) heart failure: Secondary | ICD-10-CM

## 2017-11-14 LAB — RENAL FUNCTION PANEL
Albumin: 2.7 g/dL — ABNORMAL LOW (ref 3.5–5.0)
Anion gap: 10 (ref 5–15)
BUN: 47 mg/dL — AB (ref 6–20)
CHLORIDE: 102 mmol/L (ref 101–111)
CO2: 28 mmol/L (ref 22–32)
Calcium: 8.6 mg/dL — ABNORMAL LOW (ref 8.9–10.3)
Creatinine, Ser: 1.46 mg/dL — ABNORMAL HIGH (ref 0.61–1.24)
GFR calc Af Amer: 50 mL/min — ABNORMAL LOW (ref >60)
GFR, EST NON AFRICAN AMERICAN: 43 mL/min — AB (ref >60)
GLUCOSE: 101 mg/dL — AB (ref 65–99)
POTASSIUM: 4.9 mmol/L (ref 3.5–5.1)
Phosphorus: 2.1 mg/dL — ABNORMAL LOW (ref 2.5–4.6)
Sodium: 140 mmol/L (ref 135–145)

## 2017-11-14 LAB — CSF CULTURE W GRAM STAIN: Culture: NO GROWTH

## 2017-11-14 LAB — CBC
HEMATOCRIT: 37.5 % — AB (ref 39.0–52.0)
Hemoglobin: 12.2 g/dL — ABNORMAL LOW (ref 13.0–17.0)
MCH: 31.2 pg (ref 26.0–34.0)
MCHC: 32.5 g/dL (ref 30.0–36.0)
MCV: 95.9 fL (ref 78.0–100.0)
PLATELETS: 156 10*3/uL (ref 150–400)
RBC: 3.91 MIL/uL — ABNORMAL LOW (ref 4.22–5.81)
RDW: 15.4 % (ref 11.5–15.5)
WBC: 8.2 10*3/uL (ref 4.0–10.5)

## 2017-11-14 LAB — MAGNESIUM: Magnesium: 2.1 mg/dL (ref 1.7–2.4)

## 2017-11-14 LAB — GLUCOSE, CAPILLARY: Glucose-Capillary: 105 mg/dL — ABNORMAL HIGH (ref 65–99)

## 2017-11-14 LAB — CSF CULTURE

## 2017-11-14 MED ORDER — HEPARIN SODIUM (PORCINE) 5000 UNIT/ML IJ SOLN
5000.0000 [IU] | Freq: Three times a day (TID) | INTRAMUSCULAR | Status: DC
  Administered 2017-11-14 – 2017-11-15 (×2): 5000 [IU] via SUBCUTANEOUS
  Filled 2017-11-14 (×2): qty 1

## 2017-11-14 MED ORDER — FUROSEMIDE 40 MG PO TABS
40.0000 mg | ORAL_TABLET | Freq: Two times a day (BID) | ORAL | Status: DC
  Administered 2017-11-14 – 2017-11-15 (×2): 40 mg via ORAL
  Filled 2017-11-14 (×2): qty 1

## 2017-11-14 NOTE — Progress Notes (Signed)
PROGRESS NOTE  [name] LernerBobby [name] WUJ:[id number] DOB: 06/06/1933 DOA: 11/06/2017 PCP: Burtis JunesBlount, Alvin Vincent, MD (Inactive)   LOS: 8 days   Brief Narrative / Interim history: 84 year old male with history of hypertension, hyperthyroidism, CAD, MI s/p PCI with stent, CKD stage 3, COPD, tobacco abuse, and new diagnosis of CHF presented to the ED 1/13 with cough, confusion, shortness of breath, that had been worsening over last 2 months. At ED he had oxygen of 82% RA. CXR positive for pulmonary vascular congestion. Patient was admitted for chronic CHF. Echo showed LVEF 15-20%c complicated by A.flutter. Left heart cath performed 1/17 showed severe multivessel CAD. LP on 1/17. Patient was transferred to ICU for A.fib with RVR 1/19 for respiratory failure. Today, patient symptoms improving.   Assessment & Plan: Principal Problem:   Acute diastolic (congestive) heart failure (HCC) Active Problems:   Hypothyroidism   TOBACCO ABUSE   HYPERTENSION, BENIGN ESSENTIAL   CAD (coronary artery disease)   Acute metabolic encephalopathy   Elevated troponin   Fall   Lactic acidosis   Diarrhea   Acute renal failure superimposed on stage 2 chronic kidney disease (HCC)   Abnormal LFTs   Acute on chronic respiratory failure with hypoxia (HCC)   Congestive heart failure (HCC)   Shortness of breath   Paroxysmal atrial fibrillation (HCC)   Acute Hypoxic Respiratory Failure/decompensated HF - new diagnosis CHF per PCP this year. Patient admitted 1/13 with O2 87% - 1/14 Echo showed LVEF 15-20% - 1/17 cardiac cath showed multiple vessel CAD - patient had FULL DNR 1/19. Transferred to floor on 1/20 - being seen by cardiology, Dr. Jacinto HalimGanji -Today patient stable, oxygen 98% RA. - continue Aspirin, Lasix  A.fib with RVR - patient has history of PAfib. Had to be transferred to ICU for respiratory distress 1/19.  - Today patient on floor, stable with no recurrent A fib. Episodes - lovenox was held in the a.m. due to  bleeding from LP site.  -Continue amiodrone and digoxin, heparin -monitor heart  CAD/Elevated troponin - BNP on 1/13 was 2,055.2 and troponin 1/14 0.37 in setting of CHF exacerbation - patient had cardiac cath 1/17  - continue aspirin, heparin - monitor cardiac enzymes  Chronic Kidney disease Stage III -Cr 1.46 today, around baseline - monitor BMP  Essential Hypertension - BP today 108/65. Patient stable, no new symptoms - continue hydralazine  RPR and T/Pallidum antibody positive, Latent Syphilis - CSF VDRL nonreactive.  - Treated for latent syphilis    DVT prophylaxis: heparin, aspirin Code Status: Full Family Communication: none Disposition Plan:home  Consultants:   Cardiology  Procedures:   LP w/ flouro guidance L1-2  Right/Left heart cath, coronary Angiogram  Antimicrobials:  Pen G   Subjective: Patient was alert sitting in bed. He said he was doing much better today. Denied SOB or chest pain.  Objective: Vitals:   11/13/17 2141 11/14/17 0012 11/14/17 0558 11/14/17 0856  BP: 113/60 114/69 (!) 105/53 108/65  Pulse: 80 85 75 89  Resp:   18 18  Temp:  98.2 F (36.8 C) 97.6 F (36.4 C) (!) 97.4 F (36.3 C)  TempSrc:  Oral Oral Oral  SpO2:  100% 97% 98%  Weight:   57.3 kg (126 lb 4.8 oz)   Height:   5\' 6"  (1.676 m)     Intake/Output Summary (Last 24 hours) at 11/14/2017 1051 Last data filed at 11/14/2017 0857 Gross per 24 hour  Intake 722.61 ml  Output 3550 ml  Net -2827.39 ml  Filed Weights   11/13/17 0600 11/13/17 1657 11/14/17 0558  Weight: 64.7 kg (142 lb 10.2 oz) 59.9 kg (132 lb) 57.3 kg (126 lb 4.8 oz)    Examination:  Constitutional: NAD Eyes:  lids and conjunctivae normal ENMT: Mucous membranes are moist. Neck: normal, supple, Respiratory: clear to auscultation bilaterally, no wheezing, no crackles. Normal respiratory effort. No accessory muscle use.  Cardiovascular: Regular rate and rhythm, no murmurs / rubs / gallops. No LE  edema.  Abdomen: no tenderness. Bowel sounds positive.  Musculoskeletal: . No joint deformity upper and lower extremities. No contractures. Normal muscle tone.  Skin: no rashes, lesions, ulcers. No induration Neurologic: CN 2-12 grossly intact. Strength 5/5 in all 4.  Psychiatric: Normal judgment and insight. Alert and oriented x 3. Normal mood.    Data Reviewed: I have independently reviewed following labs and imaging studies   CBC: Recent Labs  Lab 11/10/17 0305 11/11/17 0341 11/12/17 0358 11/13/17 0449 11/14/17 0442  WBC 7.7 8.6 8.0 7.6 8.2  NEUTROABS  --   --   --  5.0  --   HGB 12.9* 13.0 12.5* 13.8 12.2*  HCT 39.9 39.3 38.8* 42.1 37.5*  MCV 96.6 97.3 97.0 97.7 95.9  PLT 139* 152 149* 151 156   Basic Metabolic Panel: Recent Labs  Lab 11/09/17 0257  11/10/17 0305 11/11/17 0341 11/12/17 0358 11/13/17 0449 11/14/17 0442  NA 140  --  138 141 138 140 140  K 3.8  --  3.6 3.7 4.0 4.4 4.9  CL 97*  --  97* 97* 98* 97* 102  CO2 32  --  31 34* 31 31 28   GLUCOSE 116*  --  115* 109* 135* 97 101*  BUN 37*  --  30* 24* 24* 35* 47*  CREATININE 1.81*   < > 1.42* 1.37* 1.30* 1.44* 1.46*  CALCIUM 7.9*  --  7.8* 8.1* 8.0* 8.5* 8.6*  MG 1.6*  --  2.0  --   --  1.8 2.1  PHOS  --   --   --   --  2.0* 2.1* 2.1*   < > = values in this interval not displayed.   GFR: Estimated Creatinine Clearance: 31.6 mL/min (A) (by C-G formula based on SCr of 1.46 mg/dL (H)). Liver Function Tests: Recent Labs  Lab 11/09/17 0257 11/12/17 0358 11/13/17 0449 11/14/17 0442  AST 53*  --   --   --   ALT 41  --   --   --   ALKPHOS 101  --   --   --   BILITOT 0.7  --   --   --   PROT 4.8*  --   --   --   ALBUMIN 2.8* 2.7* 2.9* 2.7*   No results for input(s): LIPASE, AMYLASE in the last 168 hours. No results for input(s): AMMONIA in the last 168 hours. Coagulation Profile: No results for input(s): INR, PROTIME in the last 168 hours. Cardiac Enzymes: No results for input(s): CKTOTAL, CKMB,  CKMBINDEX, TROPONINI in the last 168 hours. BNP (last 3 results) No results for input(s): PROBNP in the last 8760 hours. HbA1C: No results for input(s): HGBA1C in the last 72 hours. CBG: Recent Labs  Lab 11/12/17 0358 11/12/17 0829 11/12/17 1230 11/12/17 1623 11/13/17 1646  GLUCAP 121* 132* 87 105* 83   Lipid Profile: No results for input(s): CHOL, HDL, LDLCALC, TRIG, CHOLHDL, LDLDIRECT in the last 72 hours. Thyroid Function Tests: No results for input(s): TSH, T4TOTAL, FREET4, T3FREE, THYROIDAB in  the last 72 hours. Anemia Panel: No results for input(s): VITAMINB12, FOLATE, FERRITIN, TIBC, IRON, RETICCTPCT in the last 72 hours. Urine analysis:    Component Value Date/Time   COLORURINE STRAW (A) 11/06/2017 1804   APPEARANCEUR CLEAR 11/06/2017 1804   LABSPEC 1.009 11/06/2017 1804   PHURINE 5.0 11/06/2017 1804   GLUCOSEU NEGATIVE 11/06/2017 1804   HGBUR NEGATIVE 11/06/2017 1804   BILIRUBINUR NEGATIVE 11/06/2017 1804   KETONESUR NEGATIVE 11/06/2017 1804   PROTEINUR NEGATIVE 11/06/2017 1804   NITRITE NEGATIVE 11/06/2017 1804   LEUKOCYTESUR NEGATIVE 11/06/2017 1804   Sepsis Labs: Invalid input(s): PROCALCITONIN, LACTICIDVEN  Recent Results (from the past 240 hour(s))  Culture, blood (Routine X 2) w Reflex to ID Panel     Status: None   Collection Time: 11/06/17  7:40 PM  Result Value Ref Range Status   Specimen Description BLOOD RIGHT ANTECUBITAL  Final   Special Requests   Final    BOTTLES DRAWN AEROBIC AND ANAEROBIC Blood Culture adequate volume   Culture NO GROWTH 5 DAYS  Final   Report Status 11/11/2017 FINAL  Final  Culture, blood (Routine X 2) w Reflex to ID Panel     Status: None   Collection Time: 11/06/17  7:57 PM  Result Value Ref Range Status   Specimen Description BLOOD RIGHT HAND  Final   Special Requests IN PEDIATRIC BOTTLE Blood Culture adequate volume  Final   Culture NO GROWTH 5 DAYS  Final   Report Status 11/11/2017 FINAL  Final  Urine Culture      Status: None   Collection Time: 11/07/17 12:55 AM  Result Value Ref Range Status   Specimen Description URINE, RANDOM  Final   Special Requests NONE  Final   Culture NO GROWTH  Final   Report Status 11/08/2017 FINAL  Final  MRSA PCR Screening     Status: None   Collection Time: 11/07/17  9:57 PM  Result Value Ref Range Status   MRSA by PCR NEGATIVE NEGATIVE Final    Comment:        The GeneXpert MRSA Assay (FDA approved for NASAL specimens only), is one component of a comprehensive MRSA colonization surveillance program. It is not intended to diagnose MRSA infection nor to guide or monitor treatment for MRSA infections.   CSF culture     Status: None (Preliminary result)   Collection Time: 11/10/17  3:55 PM  Result Value Ref Range Status   Specimen Description CSF  Final   Special Requests NONE  Final   Gram Stain   Final    CYTOSPIN SMEAR WBC PRESENT, PREDOMINANTLY MONONUCLEAR NO ORGANISMS SEEN    Culture NO GROWTH 3 DAYS  Final   Report Status PENDING  Incomplete      Radiology Studies: Dg Chest Port 1 View  Result Date: 11/13/2017 CLINICAL DATA:  Respiratory failure EXAM: PORTABLE CHEST 1 VIEW COMPARISON:  November 12, 2017 FINDINGS: There is slight atelectasis in the right base. There is no edema or consolidation. There is cardiomegaly with pulmonary vascularity within normal limits. There is aortic atherosclerosis. No evident bone lesions. IMPRESSION: Cardiomegaly without appreciable edema or consolidation. Mild right base atelectasis. There is aortic atherosclerosis. Aortic Atherosclerosis (ICD10-I70.0). Electronically Signed   By: Bretta Bang III M.D.   On: 11/13/2017 07:38     Scheduled Meds: . amiodarone  400 mg Oral BID  . aspirin  324 mg Oral Daily  . atorvastatin  40 mg Oral q1800  . digoxin  0.125 mg Oral Daily  .  furosemide  40 mg Intravenous Q12H  . heparin  5,000 Units Subcutaneous Q8H  . hydrALAZINE  25 mg Oral Q8H  . mupirocin cream   Topical  Daily  . nicotine  21 mg Transdermal Daily  . penicillin g benzathine (BICILLIN-LA) IM  2.4 Million Units Intramuscular Weekly  . potassium chloride  40 mEq Oral Daily  . sodium chloride flush  3 mL Intravenous Q12H   Continuous Infusions: . sodium chloride 250 mL (11/12/17 2000)       Time spent:    Allisha Harter, PA-S

## 2017-11-14 NOTE — Progress Notes (Signed)
Subjective:  Has maintained sinus. Has started to feel the best and no desaturation. Now getting ready to do PT and says he is up to it. Denies any dark stools.    Objective:  Vital Signs in the last 24 hours: Temp:  [97.3 F (36.3 C)-98.3 F (36.8 C)] 97.6 F (36.4 C) (01/21 0558) Pulse Rate:  [51-88] 75 (01/21 0558) Resp:  [17-40] 18 (01/21 0558) BP: (98-133)/(53-80) 105/53 (01/21 0558) SpO2:  [85 %-100 %] 97 % (01/21 0558) Weight:  [57.3 kg (126 lb 4.8 oz)-59.9 kg (132 lb)] 57.3 kg (126 lb 4.8 oz) (01/21 0558)  Intake/Output from previous day: 01/20 0701 - 01/21 0700 In: 792.7 [P.O.:510; I.V.:282.7] Out: 3300 [Urine:3300]  Physical Exam:  General appearance: alert, cooperative, appears older than stated age, no distress and increased work of breathing. Looks comfortable. Eyes: negative findings: lids and lashes normal Neck: JVD to the angle of the jaw.  Neck: JVP - normal, carotids 2+= without bruits Resp: Barrel-shaped chest.  Decreased air entry bilateral, coarse crackles bilateral bases, occasional expiratory wheeze. Chest wall: no tenderness Cardio: regular rate and rhythm, S1, S2 normal, no murmur, click, rub or gallop and Distant heart sounds.  S1 and S2 is normal, no gallop or murmur appreciated. GI: soft, non-tender; bowel sounds normal; no masses,  no organomegaly and Liver is just felt below the right costal margin.  Mildly tender. Extremities: extremities normal, atraumatic, no cyanosis or edema    Lab Results: BMP Recent Labs    11/12/17 0358 11/13/17 0449 11/14/17 0442  NA 138 140 140  K 4.0 4.4 4.9  CL 98* 97* 102  CO2 31 31 28   GLUCOSE 135* 97 101*  BUN 24* 35* 47*  CREATININE 1.30* 1.44* 1.46*  CALCIUM 8.0* 8.5* 8.6*  GFRNONAA 49* 44* 43*  GFRAA 57* 51* 50*    CBC Recent Labs  Lab 11/13/17 0449 11/14/17 0442  WBC 7.6 8.2  RBC 4.31 3.91*  HGB 13.8 12.2*  HCT 42.1 37.5*  PLT 151 156  MCV 97.7 95.9  MCH 32.0 31.2  MCHC 32.8 32.5  RDW  15.5 15.4  LYMPHSABS 1.7  --   MONOABS 0.7  --   EOSABS 0.1  --   BASOSABS 0.0  --     HEMOGLOBIN A1C Lab Results  Component Value Date   HGBA1C 6.3 (H) 11/07/2017   MPG 134.11 11/07/2017    Cardiac Panel (last 3 results) Recent Labs    11/06/17 1935 11/07/17 0152 11/07/17 0731  TROPONINI 0.29* 0.44* 0.37*   TSH Recent Labs    11/07/17 0152  TSH 1.340    Lipid Panel     Component Value Date/Time   CHOL 149 11/07/2017 0201   TRIG 64 11/07/2017 0201   HDL 42 11/07/2017 0201   CHOLHDL 3.5 11/07/2017 0201   VLDL 13 11/07/2017 0201   LDLCALC 94 11/07/2017 0201     Hepatic Function Panel Recent Labs    04/13/17 0028 11/06/17 1651 11/09/17 0257 11/12/17 0358 11/13/17 0449 11/14/17 0442  PROT 7.8 6.0* 4.8*  --   --   --   ALBUMIN 4.2 3.6 2.8* 2.7* 2.9* 2.7*  AST 22 78* 53*  --   --   --   ALT 10* 59 41  --   --   --   ALKPHOS 99 110 101  --   --   --   BILITOT 0.4 1.1 0.7  --   --   --     Imaging:  Dg Chest Port 1 View  Result Date: 11/13/2017 CLINICAL DATA:  Respiratory failure EXAM: PORTABLE CHEST 1 VIEW COMPARISON:  November 12, 2017 FINDINGS: There is slight atelectasis in the right base. There is no edema or consolidation. There is cardiomegaly with pulmonary vascularity within normal limits. There is aortic atherosclerosis. No evident bone lesions. IMPRESSION: Cardiomegaly without appreciable edema or consolidation. Mild right base atelectasis. There is aortic atherosclerosis. Aortic Atherosclerosis (ICD10-I70.0). Electronically Signed   By: Bretta Bang III M.D.   On: 11/13/2017 07:38    Cardiac Studies:  EKG: EKG 11/12/2017: April fibrillation with rapid ventricular response, nonspecific ST-T abnormality.  Telemetry: Normal sinus rhythm, frequent PVCs at 8:45 AM on 11/12/2017.  ECHO: Left ventricle: The cavity size was mildly dilated. The estimated   ejection fraction was in the range of 15% to 20%. Severe diffuse   global hypokinesis. Grade II  diastolic dysfunction. Elevated LAP. - Aortic valve: There was moderate regurgitation. - Mitral valve: Mildly dilated annulus. There was mild to moderate   regurgitation. - Left atrium: The atrium was moderately dilated. - Right ventricle: The cavity size was mildly dilated. Systolic   function was moderately reduced. - Right atrium: The atrium was mildly dilated. - Tricuspid valve: Mildly dilated annulus. There was   moderate-severe regurgitation. - Pulmonary arteries: PA peak pressure: 46 mm Hg (S). May be  underestimated due to elevated RA pressure - Pericardium, extracardiac: There was a left pleural effusion.  Coronary angiogram 11/10/2017:  Severe multivessel coronary artery disease LM: Normal; LAD/Diag bifurcation 90% stenosis (1,1,0); Medium sized ramus mid 90% stenosis LCx prox 40% Grade 1 to 2 left-to-right collaterals Prox RCA 100% Elevated LVEDP. Severe biventricular failure  Scheduled Meds: . amiodarone  400 mg Oral BID  . aspirin  324 mg Oral Daily  . atorvastatin  40 mg Oral q1800  . digoxin  0.125 mg Oral Daily  . enoxaparin (LOVENOX) injection  60 mg Subcutaneous BID  . furosemide  40 mg Intravenous Q12H  . hydrALAZINE  25 mg Oral Q8H  . mupirocin cream   Topical Daily  . nicotine  21 mg Transdermal Daily  . penicillin g benzathine (BICILLIN-LA) IM  2.4 Million Units Intramuscular Weekly  . potassium chloride  40 mEq Oral Daily  . sodium chloride flush  3 mL Intravenous Q12H   Continuous Infusions: . sodium chloride 250 mL (11/12/17 2000)   PRN Meds:.sodium chloride, acetaminophen, dextromethorphan-guaiFENesin, hydrALAZINE, ipratropium-albuterol, metoprolol tartrate, morphine injection, nitroGLYCERIN, ondansetron (ZOFRAN) IV, sodium chloride flush   Assessment/Plan:  1.  Acute on chronic systolic and diastolic heart failure with biventricular failure 2.  Acute on chronic cor pulmonale secondary to underlying severe COPD and emphysema. 3.  Multivessel coronary  artery disease, not felt to be a surgical candidate. 4.  Chronic stage III kidney disease, serum creatinine is stable. 5.  Probable aspiration pneumonia versus pneumonia right base, new chest x-ray finding.  Worsening heart failure on chest x-ray. 6. Bleeding at the LP site now stable and off lovenox. 7. Guaic positive stool. H/H stable 8. Paroxysmal A. Fib on Amiodarone and maintains sinus. CHA2DS2-VASCScore: Risk Score  4,  Yearly risk of stroke  4.   Recommendation:  He is looking the best he has since admission. Diuresing well. He has diuresed well and BP is stable, presently on IV lasix and Hydralazine added. Will try to swap to Bidil tomorrow. Continue IV lasix today and switch to PO tomorrow. Continue Amio. Will switch to DVT dose Heparin due to GI bleed  and black stools which are guaic positive. No angina. DNR.   Yates DecampJay Rhian Funari, M.D. 11/14/2017, 8:33 AM [facility] Pager: [phone] Office: [phone] If no answer: [phone]

## 2017-11-14 NOTE — Clinical Social Work Note (Signed)
Clinical Social Work Assessment  Patient Details  Name: [name] [name] MRN: [id number] Date of Birth: [birth year]  Date of referral:  11/14/17               Reason for consult:  Facility Placement                Permission sought to share information with:  Chartered certified accountant granted to share information::  Yes, Verbal Permission Granted  Name::     [name] [name]  Agency::  SNF  Relationship::  sister  Contact Information:     Housing/Transportation Living arrangements for the past 2 months:  Single Family Home Source of Information:  Siblings, Patient, Other (Comment Required)(Other Family Member) Patient Interpreter Needed:  None Criminal Activity/Legal Involvement Pertinent to Current Situation/Hospitalization:  No - Comment as needed Significant Relationships:  Siblings, Adult Children, Other Family Members Lives with:  Self Do you feel safe going back to the place where you live?  No Need for family participation in patient care:  Yes (Comment)  Care giving concerns:  Pt resides alone and will need skilled nursing at discharge.  CSW met with patient, sister, and two cousins at bedside today to discuss the disposition. CSW answered all questions. CSW discussed Insurance Auth needed for SNF placement. Pt gave permission to discuss SNF offers with sister [name] and [name], however patient able to make own decisions at this time and was Alert and [facility].  Pt gave permission to send out to [name].  Social Worker assessment / plan:  CSW will assist with disposition process.  Employment status:  Retired Nurse, adult PT Recommendations:  [name] / Referral to community resources:  [name]  Patient/Family's Response to care:  Patient agreeable with SNF recommendations. No issues or concerns identified.  Patient/Family's Understanding of and Emotional Response to Diagnosis, Current Treatment, and  Prognosis:  Patient/family understands patient's limitations and are not able to assist him at home. Pt agrees with SNF's in [place] and will allow family to assist with selecting a SNF as he knows he cannot return home alone at this time.  CSW will f/u on SNF offers and initiate insurance auth for SNF placement.   Emotional Assessment Appearance:  Appears older than stated age Attitude/Demeanor/Rapport:  (Cooperative, but Resting) Affect (typically observed):  Accepting, Appropriate Orientation:  Oriented to Situation, Oriented to  Time, Oriented to Place, Oriented to Self Alcohol / Substance use:  Not Applicable Psych involvement (Current and /or in the community):  No (Comment)  Discharge Needs  Concerns to be addressed:  Discharge Planning Concerns Readmission within the last 30 days:  No Current discharge risk:  Dependent with Mobility, Physical Impairment, Lives alone Barriers to Discharge:  No Barriers Identified   [name], LCSW 11/14/2017, 2:20 PM

## 2017-11-14 NOTE — Consult Note (Signed)
   [facility] CM Inpatient Consult   11/14/2017  [name] [name] 1932/11/10 [id number]  Patient screened for potential [place] [facility] services. Patient is in the [place] of the [place] [facility] services under patient's [facility]  plan.  Met with the patient at the bedside and explained [place] [facility] services and post hospital needs assessed.  Patient states he has a new primary care provider Dr. Ashby Dawes.  He denies any transportation issues states he usually drives and his sister helps him out to make medical decisions. Patient with a HX of severe COPD and new HF.  Will place EMMI calls for follow up as well as his primary care provider is listed to provide the post hospital transition of care follow up.  For questions contact:   Natividad Brood, RN BSN [facility] Liaison  [phone] business mobile phone Toll free office [phone]

## 2017-11-15 ENCOUNTER — Inpatient Hospital Stay (HOSPITAL_COMMUNITY): Payer: Medicare HMO

## 2017-11-15 DIAGNOSIS — Z515 Encounter for palliative care: Secondary | ICD-10-CM

## 2017-11-15 LAB — BASIC METABOLIC PANEL
ANION GAP: 11 (ref 5–15)
BUN: 56 mg/dL — AB (ref 6–20)
CALCIUM: 8.7 mg/dL — AB (ref 8.9–10.3)
CO2: 29 mmol/L (ref 22–32)
Chloride: 102 mmol/L (ref 101–111)
Creatinine, Ser: 1.53 mg/dL — ABNORMAL HIGH (ref 0.61–1.24)
GFR calc Af Amer: 47 mL/min — ABNORMAL LOW (ref >60)
GFR, EST NON AFRICAN AMERICAN: 41 mL/min — AB (ref >60)
Glucose, Bld: 109 mg/dL — ABNORMAL HIGH (ref 65–99)
POTASSIUM: 4.6 mmol/L (ref 3.5–5.1)
SODIUM: 142 mmol/L (ref 135–145)

## 2017-11-15 LAB — BLOOD GAS, ARTERIAL
ACID-BASE EXCESS: 3.9 mmol/L — AB (ref 0.0–2.0)
Bicarbonate: 26 mmol/L (ref 20.0–28.0)
Drawn by: 518061
FIO2: 28
O2 Saturation: 96.4 %
PH ART: 7.595 — AB (ref 7.350–7.450)
Patient temperature: 98.6
pCO2 arterial: 26.7 mmHg — ABNORMAL LOW (ref 32.0–48.0)
pO2, Arterial: 76.6 mmHg — ABNORMAL LOW (ref 83.0–108.0)

## 2017-11-15 MED ORDER — FUROSEMIDE 10 MG/ML IJ SOLN
40.0000 mg | Freq: Two times a day (BID) | INTRAMUSCULAR | Status: DC
  Administered 2017-11-15 (×2): 40 mg via INTRAVENOUS
  Filled 2017-11-15 (×3): qty 4

## 2017-11-15 MED ORDER — MORPHINE SULFATE (PF) 4 MG/ML IV SOLN: 2.0000 mg | INTRAVENOUS | Status: DC | PRN

## 2017-11-15 MED ORDER — MORPHINE SULFATE (PF) 4 MG/ML IV SOLN: 4.0000 mg | INTRAVENOUS | Status: DC | PRN

## 2017-11-15 MED ORDER — FUROSEMIDE 10 MG/ML IJ SOLN
INTRAMUSCULAR | Status: AC
  Filled 2017-11-15: qty 4

## 2017-11-15 MED ORDER — ISOSORB DINITRATE-HYDRALAZINE 20-37.5 MG PO TABS
1.0000 | ORAL_TABLET | Freq: Three times a day (TID) | ORAL | Status: DC
  Administered 2017-11-15: 1 via ORAL
  Filled 2017-11-15: qty 1

## 2017-11-15 NOTE — Progress Notes (Signed)
Patient suddenly developed labored breathing. When asking the patient if he is SOB or having pain he says "no". Oxygen saturations were 86% on 2L. Oxygen increased to 3L and respiratory therapist and MD made aware to assess pt. Family at bedside. Vitals WNL. MD now at bedside. Lasix IV given per order.  10:45- pt had change in LOC and unable to answer questions, staring blankly. EKG, ABG, CXR completed. Retake of BP is low (SBP 70's). MD speaking with family members. Family seems to understand that pt is at end of life. Chaplain at bedside.

## 2017-11-15 NOTE — Care Management Important Message (Signed)
Important Message  Patient Details  Name: [name] [name] MRN: [id number] Date of Birth: 02/24/1933   Medicare Important Message Given:  Yes    Hether Anselmo Stefan ChurchBratton 11/15/2017, 3:06 PM

## 2017-11-15 NOTE — Progress Notes (Signed)
Physical Therapy Discharge Patient Details Name: Tessa LernerBobby Clardy MRN: [id number] DOB: 12/25/1932 Today's Date: 11/15/2017 Time:  -     Patient discharged from PT services secondary to medical decline - will need to re-order PT to resume therapy services.  Please see latest therapy progress note for current level of functioning and progress toward goals.    Progress and discharge plan discussed with patient and/or caregiver: Nurse caring for patient as patient is actively dying.    GP     Jahseh Lucchese Gertha CalkinJ Dyshawn Cangelosi Criag Wicklund, PTA pager [phone]  11/15/2017, 12:01 PM

## 2017-11-15 NOTE — Progress Notes (Addendum)
  Pt's vitals stable, no any complain of CP, SOB and distress, resting comfortably in bed, Condom catheter in place, oxygen continue @2l /[place], will continue to monitor the patient.  Lonia Farberekha, RN

## 2017-11-15 NOTE — Progress Notes (Signed)
PROGRESS NOTE    [name] [name]  ZOX:[id number] DOB: 02/20/1933 DOA: 11/06/2017 PCP: Burtis Junes, MD (Inactive)   Brief Narrative:  84 year old male with hypertension, hypothyroidism, CAD with history of MIs/p PCI with stent, CKD3, tobacco abuse presented withdyspnea of 2 months duration associated with cough, confusion,and a fall. Seen by his PCPa weekprior to that admission and diagnosed with new onset CHF. Started on Lasixand referred to cardiologist.At presentation to the ER,pt washypoxic with O2 sat of 84%RA, pulmonary vascular congestion on CXR.Admitted for acute on chronic CHF. 11/07/17: 2D echo revealed LVEF 15-20% complicated by Aflutter. S/p cardiac cath on 11/10/2017.   Patient with biventricular failure therefore started on IV dopamine and IV amiodarone by cardiologist. On 11/12/2017: Rapid response was called for A. fib with RVR.  Critical care was consulted and patient was transferred to ICU under critical care service.  Patient was transferred back to [facility] on 11/14/2017.  On 11/15/2016: Patient suddenly became lethargic associated with respiratory distress with blood pressure 70s over 50s.  I have discussed with patient's multiple family members including sisters regarding poor prognosis.  Discussed with Dr. Jacinto Halim from cardiology.  Now focus mostly on comfort care.  Palliative care requested.  Discussed with the palliative care team as well.  Assessment & Plan:   #Acute on chronic systolic and diastolic congestive heart failure with biventricular failure: -Patient was doing well until this morning.  When we visited patient in the morning he was cheerful and denies any chest pain or shortness of breath.  Later on when family arrived he became suddenly lethargic, respiratory distress with hypotension, blood pressure 70s over 40s.  I immediately went to the patient's bedside.  He was minimally responsive and on respiratory distress.  Lung exam were coarse.  Ordered a dose  of Lasix IV.  Stat chest x-ray obtained.  EKG with prolonged QT otherwise no ischemic change.  ABG reviewed.  Blood pressure was still low.  I discussed with patient's sister and other family members regarding poor prognosis.  Palliative care contacted.  I also discussed with Dr. Jacinto Halim who adjusted most of the medication including added morphine for the pain management.  Now mostly focus on comfort care.   -During this hospitalization patient Underwent cardiac cath with multivessel disease.  Not a candidate for cardiac surgery.  Patient was treated with milrinone and dopamine and developed A. fib with RVR.  He stayed in ICU for  few days before transferring to the floor.  #Paroxysmal atrial fibrillation with RVR: Clinically deteriorating.  Holding all cardiac medication by cardiologist.  Lasix and supportive care for now.  #Acute on chronic cor pulmonale due to severe COPD emphysema: Management as above.  #Latent syphilis: CSF VDRL negative.  Patient received penicillin on 1/18.  He requires weekly penicillin for 2 more doses.  Evaluated by infectious disease.  Patient had bleeding at the site of LP therefore off of full anticoagulation.  #Chronic kidney disease stage III: Serum creatinine level stable.  Monitor BMP.  Avoid for toxins.  On  Lasix.    #Acute respiratory failure with hypoxia #Acute metabolic encephalopathy #Hypoglycemia:  #Severe protein calorie malnutrition:  #Transaminitis: Hepatitis C virus antibody positive.  #Tobacco abuse #Frequent falls, physical deconditioning: CT MRI no acute finding. PT OT evaluation.  Patient with very poor prognosis.  Expected hospital death versus discharge to residential hospice.  Palliative care follow-up.  DVT prophylaxis: Comfort Code Status: DNR Family Communication: Discussed with the multiple family members including patient's sister Disposition Plan: Currently admitted  Consultants:   Cardiology.  P CCM  Procedures:  Cath Antimicrobials: None  Subjective: Seen and examined multiple times today.  Initially he was alert awake denies chest pain shortness of breath, headache or dizziness.  Later on he went to transfer distress, became lethargy and hypotension.  Objective: Vitals:   11/15/17 1055 11/15/17 1113 11/15/17 1119 11/15/17 1214  BP: (!) 73/44 (!) 80/40  (!) 77/55  Pulse: 97 96  81  Resp:    12  Temp:    97.8 F (36.6 C)  TempSrc:    Oral  SpO2:   98% 98%  Weight:      Height:        Intake/Output Summary (Last 24 hours) at 11/15/2017 1405 Last data filed at 11/15/2017 0941 Gross per 24 hour  Intake 390 ml  Output 1801 ml  Net -1411 ml   Filed Weights   11/13/17 1657 11/14/17 0558 11/15/17 0449  Weight: 59.9 kg (132 lb) 57.3 kg (126 lb 4.8 oz) 57.4 kg (126 lb 8.7 oz)    Examination:  General exam: Looking elderly male, lethargic and minimally responsive to the name Respiratory system: Coarse breath sound bilateral, respiratory effort normal.  Cardiovascular system: Regular rate rhythm S1-S2 normal.  No pedal edema.. Gastrointestinal system: Abdomen is nondistended, soft and nontender. Normal bowel sounds heard. Central nervous system: Alert but not oriented and looks lethargic. Skin: No rashes, lesions or ulcers Psychiatry: Lethargic    Data Reviewed: I have personally reviewed following labs and imaging studies  CBC: Recent Labs  Lab 11/10/17 0305 11/11/17 0341 11/12/17 0358 11/13/17 0449 11/14/17 0442  WBC 7.7 8.6 8.0 7.6 8.2  NEUTROABS  --   --   --  5.0  --   HGB 12.9* 13.0 12.5* 13.8 12.2*  HCT 39.9 39.3 38.8* 42.1 37.5*  MCV 96.6 97.3 97.0 97.7 95.9  PLT 139* 152 149* 151 156   Basic Metabolic Panel: Recent Labs  Lab 11/09/17 0257  11/10/17 0305 11/11/17 0341 11/12/17 0358 11/13/17 0449 11/14/17 0442 11/15/17 0513  NA 140  --  138 141 138 140 140 142  K 3.8  --  3.6 3.7 4.0 4.4 4.9 4.6  CL 97*  --  97* 97* 98* 97* 102 102  CO2 32  --  31 34* 31 31  28 29   GLUCOSE 116*  --  115* 109* 135* 97 101* 109*  BUN 37*  --  30* 24* 24* 35* 47* 56*  CREATININE 1.81*   < > 1.42* 1.37* 1.30* 1.44* 1.46* 1.53*  CALCIUM 7.9*  --  7.8* 8.1* 8.0* 8.5* 8.6* 8.7*  MG 1.6*  --  2.0  --   --  1.8 2.1  --   PHOS  --   --   --   --  2.0* 2.1* 2.1*  --    < > = values in this interval not displayed.   GFR: Estimated Creatinine Clearance: 30.2 mL/min (A) (by C-G formula based on SCr of 1.53 mg/dL (H)). Liver Function Tests: Recent Labs  Lab 11/09/17 0257 11/12/17 0358 11/13/17 0449 11/14/17 0442  AST 53*  --   --   --   ALT 41  --   --   --   ALKPHOS 101  --   --   --   BILITOT 0.7  --   --   --   PROT 4.8*  --   --   --   ALBUMIN 2.8* 2.7* 2.9* 2.7*   No  results for input(s): LIPASE, AMYLASE in the last 168 hours. No results for input(s): AMMONIA in the last 168 hours. Coagulation Profile: No results for input(s): INR, PROTIME in the last 168 hours. Cardiac Enzymes: No results for input(s): CKTOTAL, CKMB, CKMBINDEX, TROPONINI in the last 168 hours. BNP (last 3 results) No results for input(s): PROBNP in the last 8760 hours. HbA1C: No results for input(s): HGBA1C in the last 72 hours. CBG: Recent Labs  Lab 11/12/17 0358 11/12/17 0829 11/12/17 1230 11/12/17 1623 11/13/17 1646  GLUCAP 121* 132* 87 105* 83   Lipid Profile: No results for input(s): CHOL, HDL, LDLCALC, TRIG, CHOLHDL, LDLDIRECT in the last 72 hours. Thyroid Function Tests: No results for input(s): TSH, T4TOTAL, FREET4, T3FREE, THYROIDAB in the last 72 hours. Anemia Panel: No results for input(s): VITAMINB12, FOLATE, FERRITIN, TIBC, IRON, RETICCTPCT in the last 72 hours. Sepsis Labs: No results for input(s): PROCALCITON, LATICACIDVEN in the last 168 hours.  Recent Results (from the past 240 hour(s))  Culture, blood (Routine X 2) w Reflex to ID Panel     Status: None   Collection Time: 11/06/17  7:40 PM  Result Value Ref Range Status   Specimen Description BLOOD RIGHT  ANTECUBITAL  Final   Special Requests   Final    BOTTLES DRAWN AEROBIC AND ANAEROBIC Blood Culture adequate volume   Culture NO GROWTH 5 DAYS  Final   Report Status 11/11/2017 FINAL  Final  Culture, blood (Routine X 2) w Reflex to ID Panel     Status: None   Collection Time: 11/06/17  7:57 PM  Result Value Ref Range Status   Specimen Description BLOOD RIGHT HAND  Final   Special Requests IN PEDIATRIC BOTTLE Blood Culture adequate volume  Final   Culture NO GROWTH 5 DAYS  Final   Report Status 11/11/2017 FINAL  Final  Urine Culture     Status: None   Collection Time: 11/07/17 12:55 AM  Result Value Ref Range Status   Specimen Description URINE, RANDOM  Final   Special Requests NONE  Final   Culture NO GROWTH  Final   Report Status 11/08/2017 FINAL  Final  MRSA PCR Screening     Status: None   Collection Time: 11/07/17  9:57 PM  Result Value Ref Range Status   MRSA by PCR NEGATIVE NEGATIVE Final    Comment:        The GeneXpert MRSA Assay (FDA approved for NASAL specimens only), is one component of a comprehensive MRSA colonization surveillance program. It is not intended to diagnose MRSA infection nor to guide or monitor treatment for MRSA infections.   CSF culture     Status: None   Collection Time: 11/10/17  3:55 PM  Result Value Ref Range Status   Specimen Description CSF  Final   Special Requests NONE  Final   Gram Stain   Final    CYTOSPIN SMEAR WBC PRESENT, PREDOMINANTLY MONONUCLEAR NO ORGANISMS SEEN    Culture NO GROWTH 3 DAYS  Final   Report Status 11/14/2017 FINAL  Final         Radiology Studies: Dg Chest Port 1 View  Result Date: 11/15/2017 CLINICAL DATA:  Shortness of Breath EXAM: PORTABLE CHEST 1 VIEW COMPARISON:  11/13/2017 FINDINGS: Cardiomegaly. Right base atelectasis or infiltrate again noted. Left lung is clear. No effusions or acute bony abnormality. IMPRESSION: Continued right base atelectasis or infiltrate, similar prior study. Mild  cardiomegaly. Electronically Signed   By: Charlett NoseKevin  Dover M.D.   On:  11/15/2017 11:07        Scheduled Meds: . furosemide      . furosemide  40 mg Intravenous Q12H  . mupirocin cream   Topical Daily  . sodium chloride flush  3 mL Intravenous Q12H   Continuous Infusions: . sodium chloride 250 mL (11/12/17 2000)     LOS: 9 days    Dron Jaynie Collins, MD [facility] Pager [phone]  If 7PM-7AM, please contact night-coverage [email or web address] Password [facility] 11/15/2017, 2:05 PM

## 2017-11-15 NOTE — Progress Notes (Signed)
Subjective:  Sudden development of dyspnea and respiratory distress followed by Somnolence.  He was nonverbal to me, mildly tachypneic.  Low mentation. Objective:  Vital Signs in the last 24 hours: Temp:  [98 F (36.7 C)] 98 F (36.7 C) (01/22 0449) Pulse Rate:  [80-88] 88 (01/22 0449) Resp:  [18] 18 (01/22 0449) BP: (107-108)/(45-66) 108/66 (01/22 0449) SpO2:  [98 %] 98 % (01/22 0449) Weight:  [57.4 kg (126 lb 8.7 oz)] 57.4 kg (126 lb 8.7 oz) (01/22 0449)  Intake/Output from previous day: 01/21 0701 - 01/22 0700 In: 750 [P.O.:750] Out: 3001 [Urine:3000; Stool:1]  Physical Exam:  General appearance: appears older than stated age, mild distress, slowed mentation and increased work of breathing. Looks comfortable. Eyes: negative findings: lids and lashes normal Neck: JVD to the angle of the jaw.  Resp: clear to auscultation bilaterally and Barrel-shaped chest. Distant breath sounds. Chest wall: no tenderness Cardio: regular rate and rhythm, S1, S2 normal, no murmur, click, rub or gallop and Distant heart sounds.  S1 and S2 is normal, no gallop or murmur appreciated. GI: soft, non-tender; bowel sounds normal; no masses,  no organomegaly and no mass Extremities: extremities normal, atraumatic, no cyanosis or edema    Lab Results: BMP Recent Labs    11/13/17 0449 11/14/17 0442 11/15/17 0513  NA 140 140 142  K 4.4 4.9 4.6  CL 97* 102 102  CO2 31 28 29   GLUCOSE 97 101* 109*  BUN 35* 47* 56*  CREATININE 1.44* 1.46* 1.53*  CALCIUM 8.5* 8.6* 8.7*  GFRNONAA 44* 43* 41*  GFRAA 51* 50* 47*    CBC Recent Labs  Lab 11/13/17 0449 11/14/17 0442  WBC 7.6 8.2  RBC 4.31 3.91*  HGB 13.8 12.2*  HCT 42.1 37.5*  PLT 151 156  MCV 97.7 95.9  MCH 32.0 31.2  MCHC 32.8 32.5  RDW 15.5 15.4  LYMPHSABS 1.7  --   MONOABS 0.7  --   EOSABS 0.1  --   BASOSABS 0.0  --     HEMOGLOBIN A1C Lab Results  Component Value Date   HGBA1C 6.3 (H) 11/07/2017   MPG 134.11 11/07/2017     Cardiac Panel (last 3 results) Recent Labs    11/06/17 1935 11/07/17 0152 11/07/17 0731  TROPONINI 0.29* 0.44* 0.37*   TSH Recent Labs    11/07/17 0152  TSH 1.340    Lipid Panel     Component Value Date/Time   CHOL 149 11/07/2017 0201   TRIG 64 11/07/2017 0201   HDL 42 11/07/2017 0201   CHOLHDL 3.5 11/07/2017 0201   VLDL 13 11/07/2017 0201   LDLCALC 94 11/07/2017 0201     Hepatic Function Panel Recent Labs    04/13/17 0028 11/06/17 1651 11/09/17 0257 11/12/17 0358 11/13/17 0449 11/14/17 0442  PROT 7.8 6.0* 4.8*  --   --   --   ALBUMIN 4.2 3.6 2.8* 2.7* 2.9* 2.7*  AST 22 78* 53*  --   --   --   ALT 10* 59 41  --   --   --   ALKPHOS 99 110 101  --   --   --   BILITOT 0.4 1.1 0.7  --   --   --     Imaging: No results found.  Cardiac Studies:  EKG: EKG 11/12/2017: April fibrillation with rapid ventricular response, nonspecific ST-T abnormality.  Telemetry: Normal sinus rhythm, frequent PVCs at 8:45 AM on 11/12/2017.  ECHO: Left ventricle: The cavity size was mildly  dilated. The estimated   ejection fraction was in the range of 15% to 20%. Severe diffuse   global hypokinesis. Grade II diastolic dysfunction. Elevated LAP. - Aortic valve: There was moderate regurgitation. - Mitral valve: Mildly dilated annulus. There was mild to moderate   regurgitation. - Left atrium: The atrium was moderately dilated. - Right ventricle: The cavity size was mildly dilated. Systolic   function was moderately reduced. - Right atrium: The atrium was mildly dilated. - Tricuspid valve: Mildly dilated annulus. There was   moderate-severe regurgitation. - Pulmonary arteries: PA peak pressure: 46 mm Hg (S). May be  underestimated due to elevated RA pressure - Pericardium, extracardiac: There was a left pleural effusion.  Coronary angiogram 11/10/2017:  Severe multivessel coronary artery disease LM: Normal; LAD/Diag bifurcation 90% stenosis (1,1,0); Medium sized ramus mid 90%  stenosis LCx prox 40% Grade 1 to 2 left-to-right collaterals Prox RCA 100% Elevated LVEDP. Severe biventricular failure  Scheduled Meds: . amiodarone  400 mg Oral BID  . aspirin  324 mg Oral Daily  . atorvastatin  40 mg Oral q1800  . digoxin  0.125 mg Oral Daily  . furosemide  40 mg Oral BID  . heparin  5,000 Units Subcutaneous Q8H  . isosorbide-hydrALAZINE  1 tablet Oral TID  . mupirocin cream   Topical Daily  . nicotine  21 mg Transdermal Daily  . penicillin g benzathine (BICILLIN-LA) IM  2.4 Million Units Intramuscular Weekly  . potassium chloride  40 mEq Oral Daily  . sodium chloride flush  3 mL Intravenous Q12H   Continuous Infusions: . sodium chloride 250 mL (11/12/17 2000)   PRN Meds:.sodium chloride, acetaminophen, dextromethorphan-guaiFENesin, hydrALAZINE, ipratropium-albuterol, metoprolol tartrate, morphine injection, nitroGLYCERIN, ondansetron (ZOFRAN) IV, sodium chloride flush   Assessment/Plan:  1.  Acute on chronic systolic and diastolic heart failure with biventricular failure 2.  Acute on chronic cor pulmonale secondary to underlying severe COPD and emphysema. 3.  Multivessel coronary artery disease, not felt to be a surgical candidate. 4. Acute respiratory distress followed by somnolence and hypotension, suspect CVA vs cerebral  Hypoperfusion. Has mild right facial droop.   Chronic stage III kidney disease, serum creatinine is stable. 5.  Guaic positive stool. H/H stable, anticoagulation discontinued by me due to GI bleed.  6. Paroxysmal A. Fib on Amiodarone and maintains sinus. CHA2DS2-VASCScore: Risk Score  4,  Yearly risk of stroke  4.   Recommendation:   I met with the family, patient's son and his relatives were all present at the bedside. I extensively discussed the poor prognostic implications, that he could have potentially had a stroke as he is not on anticoagulation for greater than 24 hours, also could be cerebral hypoperfusion with severe diffuse  cerebral atherosclerosis to be expected in view of severe CAD, patient will be made comfort care. All medications will be redrawn except comfort measures. They understand. Patient's son Montine Circle also at the bedside.    This was end of life discussion due to acute decline in patient's status which changed dramatically today. This was a 35 minute discussion face to face with patient and family.  Adrian Prows, M.D. 11/15/2017, 9:19 AM [facility] Pager: [phone] Office: [phone] If no answer: [phone]

## 2017-11-15 NOTE — Progress Notes (Signed)
PT Cancellation Note  Patient Details Name: [name] LernerBobby [name] MRN: [id number] DOB: 03/01/1933   Cancelled Treatment:    Reason Eval/Treat Not Completed: (P) Medical issues which prohibited therapy;Patient not medically ready(Pt presents with significant medical decline, will sign off on PT services at this time and wait for new orders if patient becomes appropriate.  ) Will inform supervising PT of need to sign off on patient at this time.    Navjot Pilgrim Artis DelayJ Jyren Cerasoli 11/15/2017, 11:59 AM  Joycelyn RuaAimee Vora Clover, PTA pager [phone]

## 2017-11-15 NOTE — Progress Notes (Signed)
Nutrition Brief Note  Chart reviewed. Pt now transitioning to comfort care. Per chart review, pt is nearing end of life and actively dying- palliative care consult has been ordered.  No further nutrition interventions warranted at this time.  Please re-consult as needed.   Aahan Marques A. Mayford KnifeWilliams, RD, LDN, CDE Pager: [phone] After hours Pager: [phone]

## 2017-11-15 NOTE — Progress Notes (Signed)
No charge note:   Palliative consult received.   Chart reviewed. Unable to reach patient's son- [name] [name]. Spoke with patient's sisterMady Haagensen- [name] [name]. Discussed purpose of palliative consult. Arranged tentative meeting for tomorrow at 1030 with sister and patient's son- [name] [name].  Thank you for this consult.   Ocie BobKasie Margarite Vessel, AGNP-C Palliative Medicine  Please call Palliative Medicine team phone with any questions [phone]. For individual providers please see AMION.

## 2017-11-15 NOTE — Progress Notes (Signed)
Chaplain paged to 3E 04 to be with patient and family as patient coming closer to death.  Spent some time with family and had prayer with them.  Waiting for patient's son to arrive, hopefully before patient passes. Phebe CollaDonna S Jossue Rubenstein, Chaplain   11/15/17 1100  Clinical Encounter Type  Visited With Patient and family together  Visit Type Critical Care  Referral From Nurse  Consult/Referral To Chaplain  Spiritual Encounters  Spiritual Needs Prayer;Emotional

## 2017-11-16 DIAGNOSIS — Z7189 Other specified counseling: Secondary | ICD-10-CM

## 2017-11-16 LAB — BASIC METABOLIC PANEL
Anion gap: 14 (ref 5–15)
BUN: 66 mg/dL — ABNORMAL HIGH (ref 6–20)
CHLORIDE: 103 mmol/L (ref 101–111)
CO2: 26 mmol/L (ref 22–32)
CREATININE: 2.13 mg/dL — AB (ref 0.61–1.24)
Calcium: 9 mg/dL (ref 8.9–10.3)
GFR calc non Af Amer: 27 mL/min — ABNORMAL LOW (ref >60)
GFR, EST AFRICAN AMERICAN: 32 mL/min — AB (ref >60)
GLUCOSE: 117 mg/dL — AB (ref 65–99)
Potassium: 4.8 mmol/L (ref 3.5–5.1)
Sodium: 143 mmol/L (ref 135–145)

## 2017-11-16 LAB — DIGOXIN LEVEL: Digoxin Level: 1.1 ng/mL (ref 0.8–2.0)

## 2017-11-16 LAB — CBC
HEMATOCRIT: 25.9 % — AB (ref 39.0–52.0)
HEMOGLOBIN: 8.5 g/dL — AB (ref 13.0–17.0)
MCH: 31.7 pg (ref 26.0–34.0)
MCHC: 32.8 g/dL (ref 30.0–36.0)
MCV: 96.6 fL (ref 78.0–100.0)
Platelets: 195 10*3/uL (ref 150–400)
RBC: 2.68 MIL/uL — ABNORMAL LOW (ref 4.22–5.81)
RDW: 15.8 % — AB (ref 11.5–15.5)
WBC: 10.1 10*3/uL (ref 4.0–10.5)

## 2017-11-16 MED ORDER — SODIUM CHLORIDE 0.9% FLUSH: 3.0000 mL | INTRAVENOUS | Status: DC | PRN

## 2017-11-16 MED ORDER — GLYCOPYRROLATE 1 MG PO TABS
1.0000 mg | ORAL_TABLET | ORAL | Status: DC | PRN
  Filled 2017-11-16: qty 1

## 2017-11-16 MED ORDER — LORAZEPAM 2 MG/ML IJ SOLN
1.0000 mg | INTRAMUSCULAR | Status: DC | PRN
Start: 2017-11-16 — End: 2017-11-17

## 2017-11-16 MED ORDER — HALOPERIDOL LACTATE 5 MG/ML IJ SOLN: 0.5000 mg | INTRAMUSCULAR | Status: DC | PRN

## 2017-11-16 MED ORDER — MORPHINE SULFATE (PF) 4 MG/ML IV SOLN
2.0000 mg | INTRAVENOUS | Status: DC | PRN
  Administered 2017-11-16 – 2017-11-17 (×2): 2 mg via INTRAVENOUS
  Filled 2017-11-16 (×2): qty 1

## 2017-11-16 MED ORDER — LORAZEPAM 2 MG/ML PO CONC: 1.0000 mg | ORAL | Status: DC | PRN

## 2017-11-16 MED ORDER — HALOPERIDOL LACTATE 2 MG/ML PO CONC
0.5000 mg | ORAL | Status: DC | PRN
  Filled 2017-11-16: qty 0.3

## 2017-11-16 MED ORDER — MORPHINE SULFATE 20 MG/5ML PO SOLN: 2.5000 mg | ORAL | 0 refills | Status: AC | PRN

## 2017-11-16 MED ORDER — HALOPERIDOL 0.5 MG PO TABS
0.5000 mg | ORAL_TABLET | ORAL | Status: DC | PRN
  Filled 2017-11-16: qty 1

## 2017-11-16 MED ORDER — LORAZEPAM 1 MG PO TABS: 1.0000 mg | ORAL_TABLET | Freq: Four times a day (QID) | ORAL | 0 refills | Status: AC | PRN

## 2017-11-16 MED ORDER — LORAZEPAM 1 MG PO TABS: 1.0000 mg | ORAL_TABLET | ORAL | Status: DC | PRN

## 2017-11-16 MED ORDER — SODIUM CHLORIDE 0.9% FLUSH: 3.0000 mL | Freq: Two times a day (BID) | INTRAVENOUS | Status: DC

## 2017-11-16 MED ORDER — GLYCOPYRROLATE 0.2 MG/ML IJ SOLN
0.2000 mg | INTRAMUSCULAR | Status: DC | PRN
  Filled 2017-11-16: qty 1

## 2017-11-16 MED ORDER — SODIUM CHLORIDE 0.9 % IV SOLN: 250.0000 mL | INTRAVENOUS | Status: DC | PRN

## 2017-11-16 MED ORDER — GLYCOPYRROLATE 1 MG PO TABS: 1.0000 mg | ORAL_TABLET | ORAL | 0 refills | Status: AC | PRN

## 2017-11-16 NOTE — Progress Notes (Signed)
Pt's BP= 82/45. RN notified MD. MD states to hold scheduled lasix if systolic BP below 100.

## 2017-11-16 NOTE — Progress Notes (Addendum)
PROGRESS NOTE    [name] [name]  ZOX:[id number] DOB: [birth year] DOA: 11/06/2017 PCP: Burtis Junes, MD (Inactive)   Brief Narrative:  84 year old male with hypertension, hypothyroidism, CAD with history of MIs/p PCI with stent, CKD3, tobacco abuse presented withdyspnea of 2 months duration associated with cough, confusion,and a fall. Seen by his PCPa weekprior to that admission and diagnosed with new onset CHF. Started on Lasixand referred to cardiologist.At presentation to the ER,pt washypoxic with O2 sat of 84%RA, pulmonary vascular congestion on CXR.Admitted for acute on chronic CHF. 11/07/17: 2D echo revealed LVEF 15-20% complicated by Aflutter. S/p cardiac cath on 11/10/2017.   Patient with biventricular failure therefore started on IV dopamine and IV amiodarone by cardiologist. On 11/12/2017: Rapid response was called for A. fib with RVR.  Critical care was consulted and patient was transferred to ICU under critical care service.  Patient was transferred [facility] on 11/14/2017.  On 11/15/2016: Patient suddenly became lethargic associated with respiratory distress with blood pressure 70s over 50s.  I have discussed with patient's multiple family members including sisters regarding poor prognosis.  Discussed with Dr. Jacinto Halim from cardiology.  Now focus mostly on comfort care.  Palliative care requested.    Assessment & Plan:   #Acute on chronic systolic and diastolic congestive heart failure with biventricular failure: -Patient is now comfort care only.  Continue supportive care.  Palliative consult follow.  Family meeting today.   -During this hospitalization patient Underwent cardiac cath with multivessel disease.  Not a candidate for cardiac surgery.  Patient was treated with milrinone and dopamine and developed A. fib with RVR.  He stayed in ICU for  few days before transferring to the floor.  #Paroxysmal atrial fibrillation with RVR:  #Acute on chronic cor pulmonale due  to severe COPD emphysema:  #Latent syphilis: CSF VDRL negative.  Patient received penicillin on 1/18.  He requires weekly penicillin for 2 more doses.  Evaluated by infectious disease.  Patient had bleeding at the site of LP therefore off of full anticoagulation.  #Chronic kidney disease stage III: Serum creatinine level stable.  Monitor BMP.  Avoid for toxins.  On  Lasix.    #Acute respiratory failure with hypoxia #Acute metabolic encephalopathy #Hypoglycemia:  #Severe protein calorie malnutrition:  #Transaminitis: Hepatitis C virus antibody positive.  #Tobacco abuse #Frequent falls, physical deconditioning: CT MRI no acute finding. PT OT evaluation.  Patient with very poor prognosis.  Planning to discharge to residential hospice.  Evaluation ongoing. DVT prophylaxis: Comfort Code Status: DNR Family Communication: No family member at bedside today. Disposition Plan: Currently admitted    Consultants:   Cardiology.  P CCM  Procedures: Cath Antimicrobials: None  Subjective: Seen and examined multiple times today.  He was having mild increased work of breathing.  He denies chest pain, shortness of breath, cough, nausea vomiting.  Objective: Vitals:   11/15/17 1534 11/15/17 1918 11/16/17 0511 11/16/17 1050  BP: (!) 107/56 (!) 96/45 (!) 95/53 (!) 82/45  Pulse: 87 87 84 88  Resp:  18 18   Temp:  98.2 F (36.8 C) 98 F (36.7 C)   TempSrc:  Oral Oral   SpO2:  98% 98%   Weight:   53.4 kg (117 lb 11.6 oz)   Height:        Intake/Output Summary (Last 24 hours) at 11/16/2017 1232 Last data filed at 11/16/2017 0900 Gross per 24 hour  Intake 120 ml  Output 1200 ml  Net -1080 ml   [employer]  11/14/17 0558 11/15/17 0449 11/16/17 0511  Weight: 57.3 kg (126 lb 4.8 oz) 57.4 kg (126 lb 8.7 oz) 53.4 kg (117 lb 11.6 oz)    Examination:  General exam: Elderly looking male lying in bed. Respiratory system: Mild increased work of breathing, coarse breath sound  bilateral. Cardiovascular system: Regular rate rhythm S1-S2 normal.  No pedal edema.. Gastrointestinal system: Abdomen is nondistended, soft and nontender. Normal bowel sounds heard. Central nervous system: Alert but not oriented and looks lethargic. Skin: No rashes, lesions or ulcers Psychiatry: Lethargic    Data Reviewed: I have personally reviewed following labs and imaging studies  CBC: Recent Labs  Lab 11/11/17 0341 11/12/17 0358 11/13/17 0449 11/14/17 0442 11/16/17 0601  WBC 8.6 8.0 7.6 8.2 10.1  NEUTROABS  --   --  5.0  --   --   HGB 13.0 12.5* 13.8 12.2* 8.5*  HCT 39.3 38.8* 42.1 37.5* 25.9*  MCV 97.3 97.0 97.7 95.9 96.6  PLT 152 149* 151 156 195   Basic Metabolic Panel: Recent Labs  Lab 11/10/17 0305  11/12/17 0358 11/13/17 0449 11/14/17 0442 11/15/17 0513 11/16/17 0601  NA 138   < > 138 140 140 142 143  K 3.6   < > 4.0 4.4 4.9 4.6 4.8  CL 97*   < > 98* 97* 102 102 103  CO2 31   < > 31 31 28 29 26   GLUCOSE 115*   < > 135* 97 101* 109* 117*  BUN 30*   < > 24* 35* 47* 56* 66*  CREATININE 1.42*   < > 1.30* 1.44* 1.46* 1.53* 2.13*  CALCIUM 7.8*   < > 8.0* 8.5* 8.6* 8.7* 9.0  MG 2.0  --   --  1.8 2.1  --   --   PHOS  --   --  2.0* 2.1* 2.1*  --   --    < > = values in this interval not displayed.   GFR: Estimated Creatinine Clearance: 20.2 mL/min (A) (by C-G formula based on SCr of 2.13 mg/dL (H)). Liver Function Tests: Recent Labs  Lab 11/12/17 0358 11/13/17 0449 11/14/17 0442  ALBUMIN 2.7* 2.9* 2.7*   No results for input(s): LIPASE, AMYLASE in the last 168 hours. No results for input(s): AMMONIA in the last 168 hours. Coagulation Profile: No results for input(s): INR, PROTIME in the last 168 hours. Cardiac Enzymes: No results for input(s): CKTOTAL, CKMB, CKMBINDEX, TROPONINI in the last 168 hours. BNP (last 3 results) No results for input(s): PROBNP in the last 8760 hours. HbA1C: No results for input(s): HGBA1C in the last 72  hours. CBG: Recent Labs  Lab 11/12/17 0358 11/12/17 0829 11/12/17 1230 11/12/17 1623 11/13/17 1646  GLUCAP 121* 132* 87 105* 83   Lipid Profile: No results for input(s): CHOL, HDL, LDLCALC, TRIG, CHOLHDL, LDLDIRECT in the last 72 hours. Thyroid Function Tests: No results for input(s): TSH, T4TOTAL, FREET4, T3FREE, THYROIDAB in the last 72 hours. Anemia Panel: No results for input(s): VITAMINB12, FOLATE, FERRITIN, TIBC, IRON, RETICCTPCT in the last 72 hours. Sepsis Labs: No results for input(s): PROCALCITON, LATICACIDVEN in the last 168 hours.  Recent Results (from the past 240 hour(s))  Culture, blood (Routine X 2) w Reflex to ID Panel     Status: None   Collection Time: 11/06/17  7:40 PM  Result Value Ref Range Status   Specimen Description BLOOD RIGHT ANTECUBITAL  Final   Special Requests   Final    BOTTLES DRAWN AEROBIC AND ANAEROBIC  Blood Culture adequate volume   Culture NO GROWTH 5 DAYS  Final   Report Status 11/11/2017 FINAL  Final  Culture, blood (Routine X 2) w Reflex to ID Panel     Status: None   Collection Time: 11/06/17  7:57 PM  Result Value Ref Range Status   Specimen Description BLOOD RIGHT HAND  Final   Special Requests IN PEDIATRIC BOTTLE Blood Culture adequate volume  Final   Culture NO GROWTH 5 DAYS  Final   Report Status 11/11/2017 FINAL  Final  Urine Culture     Status: None   Collection Time: 11/07/17 12:55 AM  Result Value Ref Range Status   Specimen Description URINE, RANDOM  Final   Special Requests NONE  Final   Culture NO GROWTH  Final   Report Status 11/08/2017 FINAL  Final  MRSA PCR Screening     Status: None   Collection Time: 11/07/17  9:57 PM  Result Value Ref Range Status   MRSA by PCR NEGATIVE NEGATIVE Final    Comment:        The GeneXpert MRSA Assay (FDA approved for NASAL specimens only), is one component of a comprehensive MRSA colonization surveillance program. It is not intended to diagnose MRSA infection nor to guide  or monitor treatment for MRSA infections.   CSF culture     Status: None   Collection Time: 11/10/17  3:55 PM  Result Value Ref Range Status   Specimen Description CSF  Final   Special Requests NONE  Final   Gram Stain   Final    CYTOSPIN SMEAR WBC PRESENT, PREDOMINANTLY MONONUCLEAR NO ORGANISMS SEEN    Culture NO GROWTH 3 DAYS  Final   Report Status 11/14/2017 FINAL  Final         Radiology Studies: Dg Chest Port 1 View  Result Date: 11/15/2017 CLINICAL DATA:  Shortness of Breath EXAM: PORTABLE CHEST 1 VIEW COMPARISON:  11/13/2017 FINDINGS: Cardiomegaly. Right base atelectasis or infiltrate again noted. Left lung is clear. No effusions or acute bony abnormality. IMPRESSION: Continued right base atelectasis or infiltrate, similar prior study. Mild cardiomegaly. Electronically Signed   By: Charlett NoseKevin  Dover M.D.   On: 11/15/2017 11:07        Scheduled Meds: . furosemide  40 mg Intravenous Q12H  . mupirocin cream   Topical Daily  . sodium chloride flush  3 mL Intravenous Q12H   Continuous Infusions: . sodium chloride 250 mL (11/12/17 2000)     LOS: 10 days    Dron Jaynie CollinsPrasad Bhandari, MD [facility] Pager [phone]  If 7PM-7AM, please contact night-coverage [email or web address] Password [email or web address] 11/16/2017, 12:32 PM

## 2017-11-16 NOTE — Progress Notes (Signed)
Pt's RR= 32. RN gave pt prn morphine.

## 2017-11-16 NOTE — Progress Notes (Signed)
[facility]  RN  Received request from Maralyn SagoSarah, CSW, [facility].  Chart being reviewed and spoke with Ladene Artisterrick, son, and Dondra SpryGail, sister, over the phone to acknowledge referral.  Unfortunately [facility] is not able to offer a room today.  Family and CSW are aware [facility] liaison will follow up with CSW and family tomorrow or sooner if room becomes available.  Please do not hesitate to call with questions.   Thank you for this referral.  Adele BarthelAmy Evans, RN, BSN [facility] [phone]  All hospital liaisons are now on AMION.

## 2017-11-16 NOTE — Consult Note (Signed)
Consultation Note Date: 11/16/2017   Patient Name: [name] [name]  DOB: 1933/06/18  MRN: [id number]  Age / Sex: 84 y.o., male  PCP: Kennon Holter Denton Meek, MD (Inactive) Referring Physician: Rosita Fire, MD  Reason for Consultation: Terminal Care  HPI/Patient Profile: 84 y.o. male  with past medical history of COPD w/ cor pulmonale, CAD, MI s/p PCI w/ stent, CKD3, hypertension admitted on 11/06/2017 with progressive SOB x 2 mnths, cough, confusion, fall. Workup revealed acute on chronic CHF- 15-20% EF, a flutter, latent syphilis, acute on chronic kidney injury. Had cardiac cath on 1/17- severe multifessal CAD, LAD/bifuc 90% stenosis, severe ventricular failure. Was started on milrinone and amiodarone- not candidate for repeat CABG. 1/22 had incidence of dyspnea and respiratory distress, followed by somnolence- suspect CVA vs hypoperfusion. Per family discussion with primary and cardiology team transition in care made to comfort. Palliative Medicine consulted for end of life care.   Clinical Assessment and Goals of Care: Patient in bed- does not complain of SOB. Lethargic. Oriented to place. Unable to carryon lengthy discussion about his condition. He drifts back to sleep while talking.   Met with patient's son- Derrek, SisterBaker Janus and his cousins. Discussed transition to comfort care per their discussion with primary team yesterday.   Brief life review conducted- patient was funny and enjoyed hosting people, frying fish and cooking out.   Family hopes for patient to have peaceful dying process without suffering. Discussed use of comfort medications.   They request to transfer patient to residential hospice- they specifically request [facility] for proximity to their home.   Primary Decision Maker NEXT OF KIN - patient's sonVicente Males, with assistance from sister [name].    SUMMARY OF  RECOMMENDATIONS -Full comfort care -Morphine 19m IV q1hr prn SOB or pain -lorazepam   147mIV or SL q4hr prn anxiety -robinul 0.7m87mIV for terminal secretions   Code Status/Advance Care Planning:  DNR   Palliative Prophylaxis:   Frequent Pain Assessment and frequent assessment for SOB  Additional Recommendations (Limitations, Scope, Preferences):  Full Comfort Care  Prognosis:    Hours - Days due to biventricular heart failure, hypoperfusion vs CVA, impending cardiac shock, transition to comfort measures only  Discharge Planning: Hospice facility  Primary Diagnoses: Present on Admission: . TOBACCO ABUSE . Hypothyroidism . HYPERTENSION, BENIGN ESSENTIAL . CAD (coronary artery disease) . Acute metabolic encephalopathy . Elevated troponin . Fall . Lactic acidosis . Diarrhea . Acute renal failure superimposed on stage 2 chronic kidney disease ([facility] Abnormal LFTs . Acute diastolic (congestive) heart failure ([facility] Acute on chronic respiratory failure with hypoxia ([facility] I have reviewed the medical record, interviewed the patient and family, and examined the patient. The following aspects are pertinent.  Past Medical History:  Diagnosis Date  . CHF (congestive heart failure) (HCC[facility] . CKD (chronic kidney disease), stage II   . Heart attack ([facility] . Hypertension    Social History   Socioeconomic History  . Marital status: Widowed  Spouse name: None  . Number of children: None  . Years of education: None  . Highest education level: None  Social Needs  . Financial resource strain: None  . Food insecurity - worry: None  . Food insecurity - inability: None  . Transportation needs - medical: None  . Transportation needs - non-medical: None  Occupational History  . None  Tobacco Use  . Smoking status: Heavy Tobacco Smoker    Packs/day: 0.50    Years: 70.00    Pack years: 35.00  . Smokeless tobacco: Never Used  Substance and Sexual Activity  .  Alcohol use: No  . Drug use: No  . Sexual activity: None  Other Topics Concern  . None  Social History Narrative  . None   Family History  Problem Relation Age of Onset  . Aneurysm Mother   . Heart failure Brother    Scheduled Meds: . furosemide  40 mg Intravenous Q12H  . mupirocin cream   Topical Daily  . sodium chloride flush  3 mL Intravenous Q12H   Continuous Infusions: . sodium chloride 250 mL (11/12/17 2000)   PRN Meds:.sodium chloride, acetaminophen, dextromethorphan-guaiFENesin, ipratropium-albuterol, morphine injection, nitroGLYCERIN, ondansetron (ZOFRAN) IV, sodium chloride flush Medications Prior to Admission:  Prior to Admission medications   Medication Sig Start Date End Date Taking? Authorizing Provider  albuterol (PROVENTIL HFA;VENTOLIN HFA) 108 (90 Base) MCG/ACT inhaler Inhale 2 puffs into the lungs every 4 (four) hours as needed for wheezing or shortness of breath. 01/02/17  Yes Lajean Saver, MD  furosemide (LASIX) 20 MG tablet Take 20 mg by mouth daily. 11/02/17  Yes [provider]  metoprolol tartrate (LOPRESSOR) 25 MG tablet Take 25 mg by mouth 2 (two) times daily. 11/02/17  Yes [provider]  potassium chloride (K-DUR) 10 MEQ tablet Take 10 mEq by mouth 2 (two) times daily. 11/02/17  Yes [provider]  budesonide-formoterol (SYMBICORT) 160-4.5 MCG/ACT inhaler Inhale 2 puffs into the lungs 2 (two) times daily. Patient not taking: Reported on 11/06/2017 01/28/17 04/13/17  Marshell Garfinkel, MD   No Known Allergies Review of Systems  Unable to perform ROS: Acuity of condition    Physical Exam  Constitutional:  cachexic   Cardiovascular:  Irregular Peripheral pulses weak  Pulmonary/Chest: No respiratory distress.  tachypneic  Neurological:  lethargic  Skin: Skin is warm and dry.  Nursing note and vitals reviewed.   Vital Signs: BP (!) 82/45 (BP Location: Left Arm)   Pulse 88   Temp 98 F (36.7 C) (Oral)   Resp 18   Ht 5' 6"   (1.676 m)   Wt 53.4 kg (117 lb 11.6 oz)   SpO2 98%   BMI 19.00 kg/m  Pain Assessment: No/denies pain   Pain Score: 0-No pain   SpO2: SpO2: 98 % O2 Device:SpO2: 98 % O2 Flow Rate: .O2 Flow Rate (L/min): 2 L/min  IO: Intake/output summary:   Intake/Output Summary (Last 24 hours) at 11/16/2017 1109 Last data filed at 11/16/2017 0512 Gross per 24 hour  Intake 120 ml  Output 1800 ml  Net -1680 ml    LBM: Last BM Date: 11/14/17 Baseline Weight: Weight: 66.5 kg (146 lb 9.7 oz) Most recent weight: Weight: 53.4 kg (117 lb 11.6 oz)     Palliative Assessment/Data: PPS: 20%     Thank you for this consult. Palliative medicine will continue to follow and assist as needed.   Time In: 1000 Time Out: 1130 Time Total: 90 minutes Prolonged services billed: Yes Greater  than 50%  of this time was spent counseling and coordinating care related to the above assessment and plan.  Signed by: Mariana Kaufman, AGNP-C Palliative Medicine    Please contact Palliative Medicine Team phone at [phone] for questions and concerns.  For individual provider: See She[facility]

## 2017-11-16 NOTE — Clinical Social Work Note (Signed)
CSW received voicemail from palliative NP that [facility]. Referral made to [facility]. They do not have beds today but are hopeful for one tomorrow. She has reached out to patient's son.  Charlynn CourtSarah Donald Jacque, CSW [phone]

## 2017-11-17 MED ORDER — MORPHINE SULFATE (CONCENTRATE) 10 MG/0.5ML PO SOLN
5.0000 mg | ORAL | Status: DC
  Administered 2017-11-17 (×3): 5 mg via SUBLINGUAL
  Filled 2017-11-17 (×3): qty 0.5

## 2017-11-17 MED ORDER — BIOTENE DRY MOUTH MT LIQD: 15.0000 mL | OROMUCOSAL | Status: DC | PRN

## 2017-11-17 MED ORDER — POLYVINYL ALCOHOL 1.4 % OP SOLN
1.0000 [drp] | Freq: Four times a day (QID) | OPHTHALMIC | Status: DC | PRN
  Filled 2017-11-17: qty 15

## 2017-11-17 NOTE — Progress Notes (Signed)
Daily Progress Note   Patient Name: [name] [name]       Date: 11/17/2017 DOB: 11/19/1932  Age: 84 y.o. MRN#: [id number] Attending Physician: Maxie BarbBhandari, Dron Prasad, MD Primary Care Physician: Burtis JunesBlount, Alvin Vincent, MD (Inactive) Admit Date: 11/06/2017  Reason for Consultation/Follow-up: Terminal Care  Subjective: Patient in bed. Awake, SOB. Family at bedside. [facility]- referral made to [facility].   ROS  Length of Stay: 11  Current Medications: Scheduled Meds:  . morphine CONCENTRATE  5 mg Sublingual Q2H  . mupirocin cream   Topical Daily  . sodium chloride flush  3 mL Intravenous Q12H    Continuous Infusions: . sodium chloride 250 mL (11/12/17 2000)    PRN Meds: sodium chloride, acetaminophen, dextromethorphan-guaiFENesin, glycopyrrolate **OR** glycopyrrolate **OR** glycopyrrolate, haloperidol **OR** haloperidol **OR** haloperidol lactate, LORazepam **OR** LORazepam **OR** LORazepam, morphine injection, nitroGLYCERIN, ondansetron (ZOFRAN) IV, sodium chloride flush  Physical Exam  Constitutional:  Cachetic   Cardiovascular:  Tachy, irregular  Pulmonary/Chest:  Increased WOB  Neurological:  Awake, but lethargic, no verbal response to questions            Vital Signs: BP (!) 90/55 (BP Location: Left Arm)   Pulse 87   Temp 99.3 F (37.4 C) (Oral)   Resp (!) 30   Ht 5\' 6"  (1.676 m)   Wt 51 kg (112 lb 7 oz)   SpO2 96%   BMI 18.15 kg/m  SpO2: SpO2: 96 % O2 Device: O2 Device: Nasal Cannula O2 Flow Rate: O2 Flow Rate (L/min): 2 L/min  Intake/output summary:   Intake/Output Summary (Last 24 hours) at 11/17/2017 1033 Last data filed at 11/17/2017 1031 Gross per 24 hour  Intake 3 ml  Output 300 ml  Net -297 ml   LBM: Last BM Date:  11/14/17 Baseline Weight: Weight: 66.5 kg (146 lb 9.7 oz) Most recent weight: Weight: 51 kg (112 lb 7 oz)       Palliative Assessment/Data: PPS: 10%     Patient Active Problem List   Diagnosis Date Noted  . Advance care planning   . Goals of care, counseling/discussion   . Palliative care by specialist   . VDRL positive   . CKD (chronic kidney disease), stage III (HCC)   . Shortness of breath   . Paroxysmal atrial fibrillation (HCC)   .  Acute metabolic encephalopathy 11/06/2017  . Elevated troponin 11/06/2017  . Fall 11/06/2017  . Lactic acidosis 11/06/2017  . Diarrhea 11/06/2017  . Acute renal failure superimposed on stage 2 chronic kidney disease (HCC) 11/06/2017  . Abnormal LFTs 11/06/2017  . Acute diastolic (congestive) heart failure (HCC) 11/06/2017  . Acute on chronic respiratory failure with hypoxia (HCC) 11/06/2017  . Congestive heart failure (HCC) 11/06/2017  . MIXED HYPERLIPIDEMIA 04/23/2009  . Hypothyroidism 10/31/2008  . TOBACCO ABUSE 10/31/2008  . HYPERTENSION, BENIGN ESSENTIAL 10/31/2008  . OLD MYOCARDIAL INFARCTION 10/31/2008  . CAD (coronary artery disease) 10/31/2008    Palliative Care Assessment & Plan   Patient Profile: 84 y.o. male  with past medical history of COPD w/ cor pulmonale, CAD, MI s/p PCI w/ stent, CKD3, hypertension admitted on 11/06/2017 with progressive SOB x 2 mnths, cough, confusion, fall. Workup revealed acute on chronic CHF- 15-20% EF, a flutter, latent syphilis, acute on chronic kidney injury. Had cardiac cath on 1/17- severe multifessal CAD, LAD/bifuc 90% stenosis, severe ventricular failure. Was started on milrinone and amiodarone- not candidate for repeat CABG. 1/22 had incidence of dyspnea and respiratory distress, followed by somnolence- suspect CVA vs hypoperfusion. Per family discussion with primary and cardiology team transition in care made to comfort. Palliative Medicine consulted for end of life care.     Assessment/Recommendations/Plan   D/C telemetry  No vital signs  Schedule morphine 5mg  SL q4hr with 2mg  IV PRN  Goals of Care and Additional Recommendations:  Limitations on Scope of Treatment: Full Comfort Care  Code Status:  DNR  Prognosis:   Hours - Days  Discharge Planning:  Hospice facility  Care plan was discussed with patient's family.  Thank you for allowing the Palliative Medicine Team to assist in the care of this patient.   Time In: 1000 Time Out: 1035 Total Time 35 mins Prolonged Time Billed no      Greater than 50%  of this time was spent counseling and coordinating care related to the above assessment and plan.  Ocie Bob, AGNP-C Palliative Medicine   Please contact Palliative Medicine Team phone at [phone] for questions and concerns.

## 2017-11-17 NOTE — Progress Notes (Signed)
[facility] - RN update  Spoke to Johnson & JohnsonLCSW Maralyn SagoSarah this morning to notify unfortunately that there [facility] this morning. LCSW will update family on current [facility] beds status.  Will continue to update on BP availability today if anything changes.  Thank you for this referral,  Roda ShuttersSherry Gibson, RN [facility] Hospital Liaison [phone]

## 2017-11-17 NOTE — Clinical Social Work Note (Addendum)
Received call from Roda ShuttersSherry Gibson with [facility]. [facility] does not have any beds available as of right now but she will notify once one is available. CSW called patient's son to notify him. Discussed potentially needing a second choice option. Also discussed with patient's other family members in the room with patient's son on speaker phone. [facility] [place] closest but [place].  Charlynn CourtSarah Pallas Wahlert, CSW [phone]  10:24 am Discharge orders and summary are in. [facility]. CSW notified patient's son.  Charlynn CourtSarah Gergory Biello, CSW [phone]  2:11 pm Received call back from [facility]. Patient has been approved and has a bed there today. Patient's son will arrive to the hospital between 2:00 and 2:30. As long as he completes the paperwork, he can discharge there today.  Charlynn CourtSarah Hurley Sobel, CSW [phone]  2:59 pm Patient's son has complete paperwork with [facility]. Patient can transport once ready.  Charlynn CourtSarah Shjon Lizarraga, CSW [phone]

## 2017-11-17 NOTE — Progress Notes (Signed)
Reviewed AVS with patient and family. Answered their questions. [facility] and talked to [facility]. Transport is being set up for patient.

## 2017-11-17 NOTE — Discharge Summary (Signed)
Physician Discharge Summary  [name] LernerBobby [name] AOZ:[id number] DOB: 02/06/1933 DOA: 11/06/2017  PCP: Burtis JunesBlount, [name] Vincent, MD (Inactive)  Admit date: 11/06/2017 Discharge date: 11/17/2017  Admitted From:home Disposition:residential hospice when bed is available.  Recommendations for Outpatient Follow-up:  1. Follow up with hospice   Home Health:hospice Equipment/Devices:hospice Discharge Condition:hospice CODE STATUS:DNR Diet recommendation:regular  Brief/Interim Summary: [age] year old male with hypertension, hypothyroidism, CAD with history of MIs/p PCI with stent, CKD3, tobacco abuse presented withdyspnea of 2 months duration associated with cough, confusion,and a fall. Seen by his PCPa weekprior to that admission and diagnosed with new onset CHF. Started on Lasixand referred to cardiologist.At presentation to the ER,pt washypoxic with O2 sat of 84%RA, pulmonary vascular congestion on CXR.Admitted for acute on chronic CHF. 11/07/17: 2D echo revealed LVEF 15-20% complicated by Aflutter. S/p cardiac cath on 11/10/2017.   Patient with biventricular failure therefore started on IV dopamine and IV amiodarone by cardiologist. On 11/12/2017: Rapid response was called for A. fib with RVR. Critical care was consulted and patient was transferred to ICU under critical care service. [facility].  On 11/15/2016: Patient suddenly became lethargic associated with respiratory distress with blood pressure 70s over 50s.  I have discussed with patient's multiple family members including sisters regarding poor prognosis.  Discussed with Dr. Jacinto HalimGanji from cardiology.  Now focus mostly on comfort care.  Palliative care requested.  Patient with no clinical improvement.  He is on full comfort care.  Discussed with the Child psychotherapistsocial worker.  Going to residential hospice when bed is available.  Hospital problem list:  #Acute on chronic systolic and diastolic congestive heart  failure with biventricular failure: -Patient is now comfort care only.   -During this hospitalization patientUnderwent cardiac cath with multivessel disease. Not a candidate for cardiac surgery.  Patient was treated with milrinone and dopamine and developed A. fib with RVR.  He stayed in ICU for  few days before transferring to the floor.  #Paroxysmal atrial fibrillation with RVR:  #Acute on chronic cor pulmonale due to severe COPD emphysema:  #Latent syphilis: CSF VDRL negative. Patient received penicillin on 1/18.  Comfort care now. #Chronic kidney disease stage III:  #Acute respiratory failure with hypoxia #Acute metabolic encephalopathy #Hypoglycemia:  #Severe protein calorie malnutrition:  #Transaminitis: Hepatitis C virus antibody positive.  #Tobacco abuse #Frequent falls, physical deconditioning: CT MRI no acute finding.     Discharge Diagnoses:  Principal Problem:   Acute diastolic (congestive) heart failure (HCC) Active Problems:   Hypothyroidism   TOBACCO ABUSE   HYPERTENSION, BENIGN ESSENTIAL   CAD (coronary artery disease)   Acute metabolic encephalopathy   Elevated troponin   Fall   Lactic acidosis   Diarrhea   Acute renal failure superimposed on stage 2 chronic kidney disease (HCC)   Abnormal LFTs   Acute on chronic respiratory failure with hypoxia (HCC)   Congestive heart failure (HCC)   Shortness of breath   Paroxysmal atrial fibrillation (HCC)   VDRL positive   CKD (chronic kidney disease), stage III [facility](HCC)   Palliative care by specialist   Advance care planning   Goals of care, counseling/discussion    Discharge Instructions  Discharge Instructions    Diet general   Complete by:  As directed    Increase activity slowly   Complete by:  As directed      Allergies as of 11/17/2017   No Known Allergies     Medication List    STOP taking these medications   budesonide-formoterol  160-4.5 MCG/ACT inhaler Commonly known as:   SYMBICORT   furosemide 20 MG tablet Commonly known as:  LASIX   metoprolol tartrate 25 MG tablet Commonly known as:  LOPRESSOR   potassium chloride 10 MEQ tablet Commonly known as:  K-DUR     TAKE these medications   albuterol 108 (90 Base) MCG/ACT inhaler Commonly known as:  PROVENTIL HFA;VENTOLIN HFA Inhale 2 puffs into the lungs every 4 (four) hours as needed for wheezing or shortness of breath.   glycopyrrolate 1 MG tablet Commonly known as:  ROBINUL Take 1 tablet (1 mg total) by mouth every 4 (four) hours as needed (excessive secretions).   LORazepam 1 MG tablet Commonly known as:  ATIVAN Take 1 tablet (1 mg total) by mouth every 6 (six) hours as needed for anxiety.   morphine 20 MG/5ML solution Take 0.6 mLs (2.4 mg total) by mouth every 3 (three) hours as needed for pain.      Follow-up Information    [name], [name] Simpler, MD. Schedule an appointment as soon as possible for a visit in 1 week(s).   Specialty:  Family Medicine Contact information: [address] [address] [place] [address] [phone]          No Known Allergies  Consultations: Cardiology Pulmonary critical care  Procedures/Studies: Cath  Subjective: Seen and examined at bedside.  Patient with mild increased work of breathing.  He is lethargic.  Review of systems limited.  Discharge Exam: Vitals:   11/16/17 2100 11/17/17 0430  BP:  (!) 87/66  Pulse:  90  Resp:  (!) 30  Temp:  99.3 F (37.4 C)  SpO2: (!) 84% 99%   Vitals:   11/16/17 1459 11/16/17 2032 11/16/17 2100 11/17/17 0430  BP: (!) 84/54 (!) 98/55  (!) 87/66  Pulse: 76 91  90  Resp: (!) 22 (!) 28  (!) 30  Temp:  99.5 F (37.5 C)  99.3 F (37.4 C)  TempSrc:  Oral  Oral  SpO2: 94%  (!) 84% 99%  Weight:    51 kg (112 lb 7 oz)  Height:        General: Lethargic ill looking male lying in bed Cardiovascular: RRR, S1/S2 +, no rubs, no gallops Respiratory: Bilateral basal rhonchi, mild increased work of  breathing. Abdominal: Soft,  ND, bowel sounds + Extremities: no edema, no cyanosis    The results of significant diagnostics from this hospitalization (including imaging, microbiology, ancillary and laboratory) are listed below for reference.     Microbiology: Recent Results (from the past 240 hour(s))  MRSA PCR Screening     Status: None   Collection Time: 11/07/17  9:57 PM  Result Value Ref Range Status   MRSA by PCR NEGATIVE NEGATIVE Final    Comment:        The GeneXpert MRSA Assay (FDA approved for NASAL specimens only), is one component of a comprehensive MRSA colonization surveillance program. It is not intended to diagnose MRSA infection nor to guide or monitor treatment for MRSA infections.   CSF culture     Status: None   Collection Time: 11/10/17  3:55 PM  Result Value Ref Range Status   Specimen Description CSF  Final   Special Requests NONE  Final   Gram Stain   Final    CYTOSPIN SMEAR WBC PRESENT, PREDOMINANTLY MONONUCLEAR NO ORGANISMS SEEN    Culture NO GROWTH 3 DAYS  Final   Report Status 11/14/2017 FINAL  Final  Labs: BNP (last 3 results) Recent Labs    11/06/17 1554  BNP 2,055.2*   Basic Metabolic Panel: Recent Labs  Lab 11/12/17 0358 11/13/17 0449 11/14/17 0442 11/15/17 0513 11/16/17 0601  NA 138 140 140 142 143  K 4.0 4.4 4.9 4.6 4.8  CL 98* 97* 102 102 103  CO2 31 31 28 29 26   GLUCOSE 135* 97 101* 109* 117*  BUN 24* 35* 47* 56* 66*  CREATININE 1.30* 1.44* 1.46* 1.53* 2.13*  CALCIUM 8.0* 8.5* 8.6* 8.7* 9.0  MG  --  1.8 2.1  --   --   PHOS 2.0* 2.1* 2.1*  --   --    Liver Function Tests: Recent Labs  Lab 11/12/17 0358 11/13/17 0449 11/14/17 0442  ALBUMIN 2.7* 2.9* 2.7*   No results for input(s): LIPASE, AMYLASE in the last 168 hours. No results for input(s): AMMONIA in the last 168 hours. CBC: Recent Labs  Lab 11/11/17 0341 11/12/17 0358 11/13/17 0449 11/14/17 0442 11/16/17 0601  WBC 8.6 8.0 7.6 8.2 10.1   NEUTROABS  --   --  5.0  --   --   HGB 13.0 12.5* 13.8 12.2* 8.5*  HCT 39.3 38.8* 42.1 37.5* 25.9*  MCV 97.3 97.0 97.7 95.9 96.6  PLT 152 149* 151 156 195   Cardiac Enzymes: No results for input(s): CKTOTAL, CKMB, CKMBINDEX, TROPONINI in the last 168 hours. BNP: Invalid input(s): POCBNP CBG: Recent Labs  Lab 11/12/17 0358 11/12/17 0829 11/12/17 1230 11/12/17 1623 11/13/17 1646  GLUCAP 121* 132* 87 105* 83   D-Dimer No results for input(s): DDIMER in the last 72 hours. Hgb A1c No results for input(s): HGBA1C in the last 72 hours. Lipid Profile No results for input(s): CHOL, HDL, LDLCALC, TRIG, CHOLHDL, LDLDIRECT in the last 72 hours. Thyroid function studies No results for input(s): TSH, T4TOTAL, T3FREE, THYROIDAB in the last 72 hours.  Invalid input(s): FREET3 Anemia work up No results for input(s): VITAMINB12, FOLATE, FERRITIN, TIBC, IRON, RETICCTPCT in the last 72 hours. Urinalysis    Component Value Date/Time   COLORURINE STRAW (A) 11/06/2017 1804   APPEARANCEUR CLEAR 11/06/2017 1804   LABSPEC 1.009 11/06/2017 1804   PHURINE 5.0 11/06/2017 1804   GLUCOSEU NEGATIVE 11/06/2017 1804   HGBUR NEGATIVE 11/06/2017 1804   BILIRUBINUR NEGATIVE 11/06/2017 1804   KETONESUR NEGATIVE 11/06/2017 1804   PROTEINUR NEGATIVE 11/06/2017 1804   NITRITE NEGATIVE 11/06/2017 1804   LEUKOCYTESUR NEGATIVE 11/06/2017 1804   Sepsis Labs Invalid input(s): PROCALCITONIN,  WBC,  LACTICIDVEN Microbiology Recent Results (from the past 240 hour(s))  MRSA PCR Screening     Status: None   Collection Time: 11/07/17  9:57 PM  Result Value Ref Range Status   MRSA by PCR NEGATIVE NEGATIVE Final    Comment:        The GeneXpert MRSA Assay (FDA approved for NASAL specimens only), is one component of a comprehensive MRSA colonization surveillance program. It is not intended to diagnose MRSA infection nor to guide or monitor treatment for MRSA infections.   CSF culture     Status: None    Collection Time: 11/10/17  3:55 PM  Result Value Ref Range Status   Specimen Description CSF  Final   Special Requests NONE  Final   Gram Stain   Final    CYTOSPIN SMEAR WBC PRESENT, PREDOMINANTLY MONONUCLEAR NO ORGANISMS SEEN    Culture NO GROWTH 3 DAYS  Final   Report Status 11/14/2017 FINAL  Final     Time  coordinating discharge: 26 minutes  SIGNED:   Maxie Barb, MD  [facility] 11/17/2017, 10:12 AM  If 7PM-7AM, please contact night-coverage [email or web address] Password [email or web address]

## 2017-11-17 NOTE — Consult Note (Signed)
   [facility]   11/17/2017  Tessa LernerBobby [name] 03/14/1933 [id number]  Follow up:  Patient per social worker will discharge to Hospice facility.  [facility] will sign off. Patient will get full support through Hospice.    Charlesetta ShanksVictoria Yolander Goodie, RN BSN CCM [facility]iaison  [phone] business mobile phone Toll free office [phone]

## 2017-11-17 NOTE — Clinical Social Work Note (Signed)
CSW facilitated patient discharge including contacting patient family and facility to confirm patient discharge plans. Clinical information faxed to facility and family agreeable with plan. CSW arranged ambulance transport via [facility] to [facility]. RN is currently on the phone with the facility to give report.  CSW will sign off for now as social work intervention is no longer needed. Please consult us again if new needs arise.  Charlynn CourtSarah Michalla Ringer, CSW [phone]

## 2017-11-18 LAB — SULFONYLUREA HYPOGLYCEMICS PANEL, SERUM
Acetohexamide: NEGATIVE ug/mL (ref 20–60)
CHLORPROPAMIDE: NEGATIVE ug/mL (ref 75–250)
GLIPIZIDE: NEGATIVE ng/mL (ref 200–1000)
GLYBURIDE: NEGATIVE ng/mL
Glimepiride: NEGATIVE ng/mL (ref 80–250)
NATEGLINIDE: NEGATIVE ng/mL
Repaglinide: NEGATIVE ng/mL
TOLAZAMIDE: NEGATIVE ug/mL
TOLBUTAMIDE: NEGATIVE ug/mL (ref 40–100)

## 2017-11-25 DEATH — deceased

## 2017-12-20 ENCOUNTER — Ambulatory Visit: Payer: Medicare HMO | Admitting: Pulmonary Disease

## 2019-03-06 IMAGING — MR MR HEAD W/O CM
9 of 10 series · 37 of 48 positions shown · non-contrast
Comparison: CT HEAD November 06, 2017 at 0103 hours

CLINICAL DATA: Shortness of breath, confusion, gait imbalance.

EXAM:
MRI HEAD WITHOUT CONTRAST
TECHNIQUE: Multiplanar, multiecho pulse sequences of the brain and surrounding
structures were obtained without intravenous contrast.

[Series 3: DWI · axial · 3.0mm · 1.09mm/px · z∈[-89,+57]mm · 11 of 100 slices shown (1 of 4)]
[im 1/100]
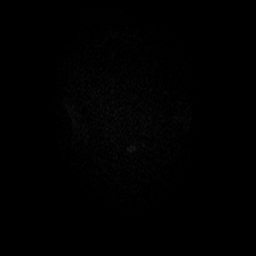
[im 10/100]
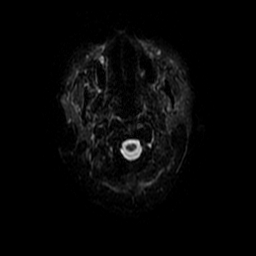
[im 20/100]
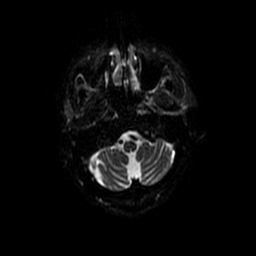
[im 30/100]
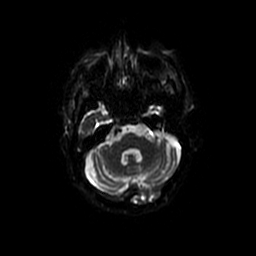
[im 40/100]
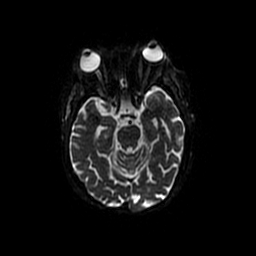
[im 50/100]
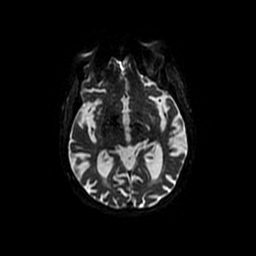
[im 60/100]
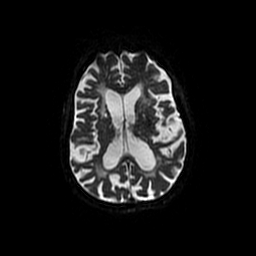
[im 70/100]
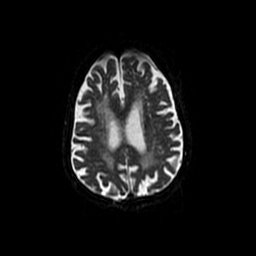
[im 80/100]
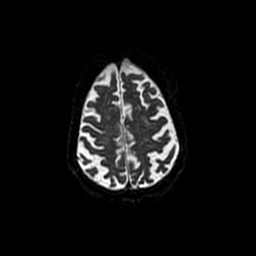
[im 90/100]
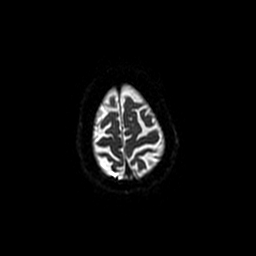
[im 100/100]
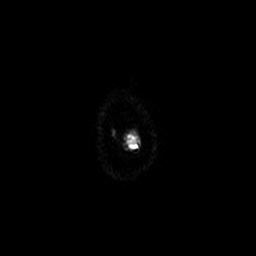

[Series 4: DWI · coronal · 5.0mm · 1.09mm/px · 8 of 66 slices shown (2 of 4)]
[im 1/66]
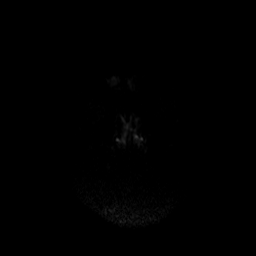
[im 10/66]
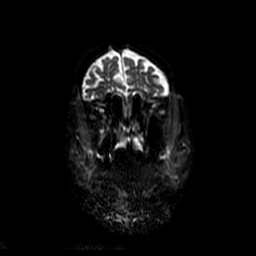
[im 19/66]
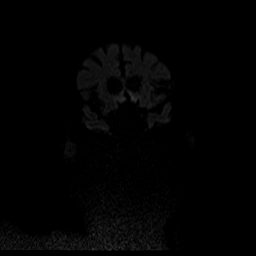
[im 28/66]
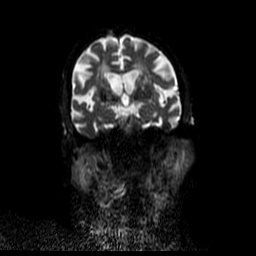
[im 38/66]
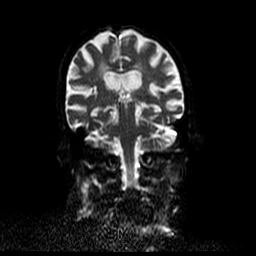
[im 47/66]
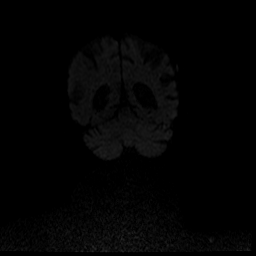
[im 56/66]
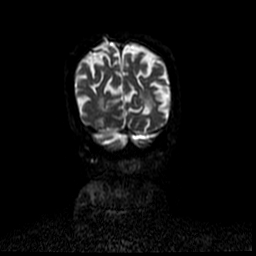
[im 66/66]
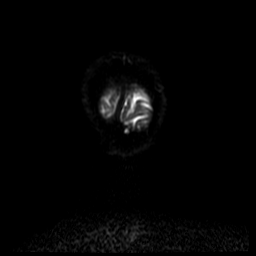

[Series 5: T1 · sagittal · 5.0mm · 0.47mm/px · 2 of 23 slices shown]
[im 1/23]
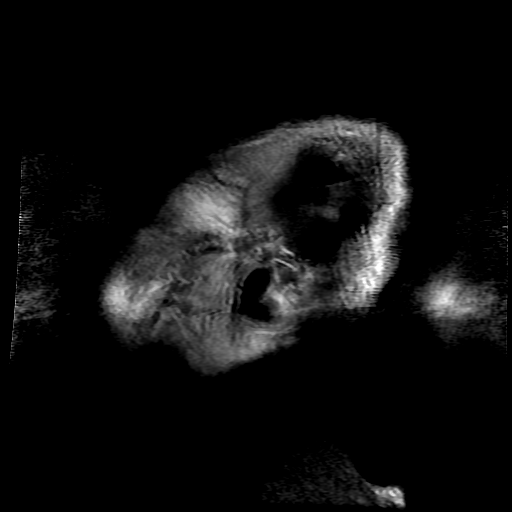
[im 23/23]
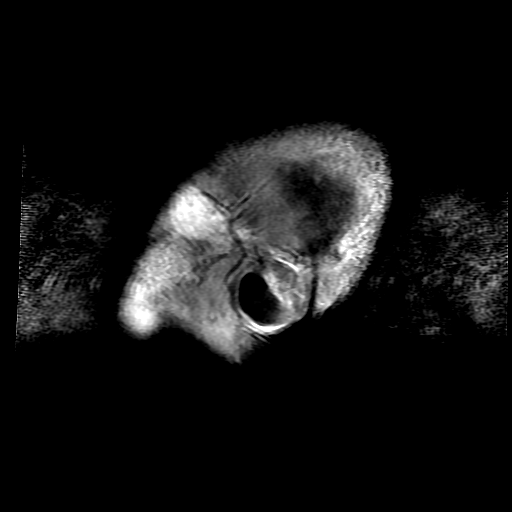

[Series 8: T2 · axial · 5.0mm · 0.43mm/px · z∈[-60,+76]mm · 2 of 24 slices shown (1 of 2)]
[im 1/24]
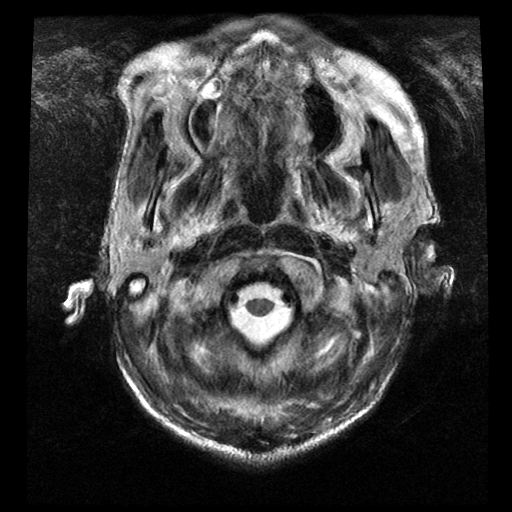
[im 24/24]
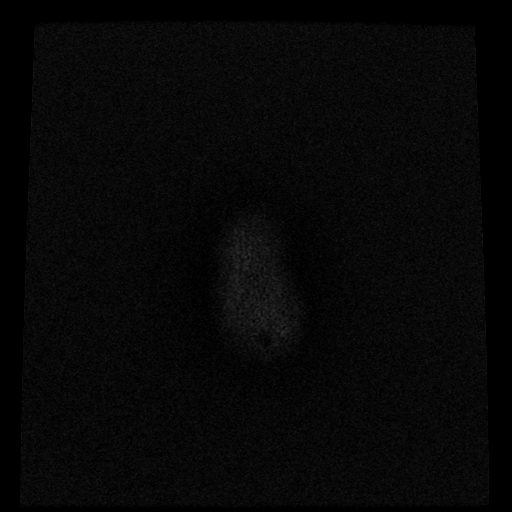

[Series 9: FLAIR · axial · 3.0mm · 0.43mm/px · z∈[-60,+76]mm · 2 of 24 slices shown]
[im 1/24]
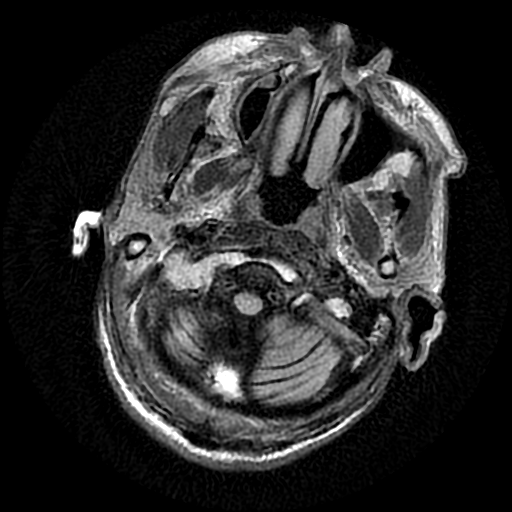
[im 24/24]
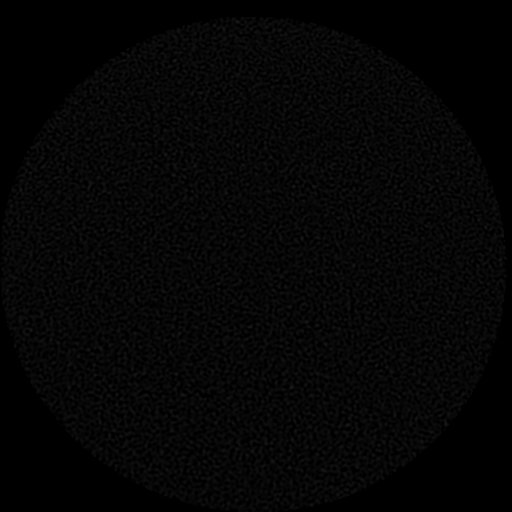

[Series 10: ax mpgr · axial · 3.0mm · 0.43mm/px · 1 of 24 slices shown]
[im 1/24]
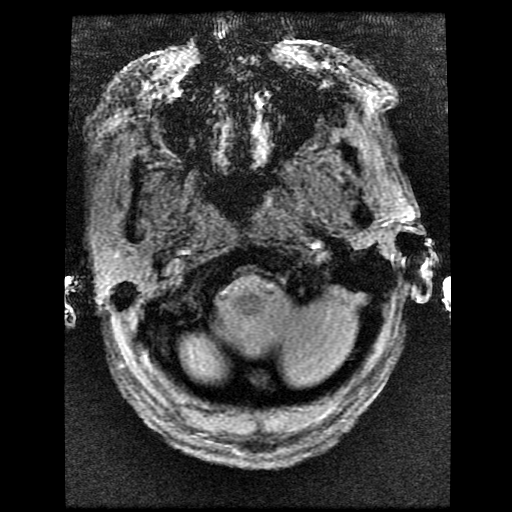

[Series 13: T2 · coronal · 5.0mm · 0.43mm/px · 3 of 28 slices shown (2 of 2)]
[im 1/28]
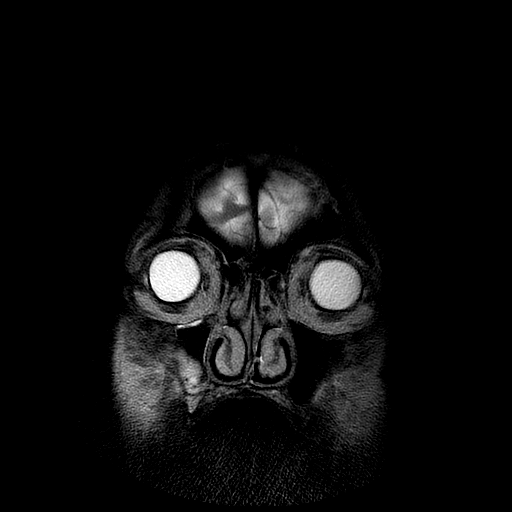
[im 14/28]
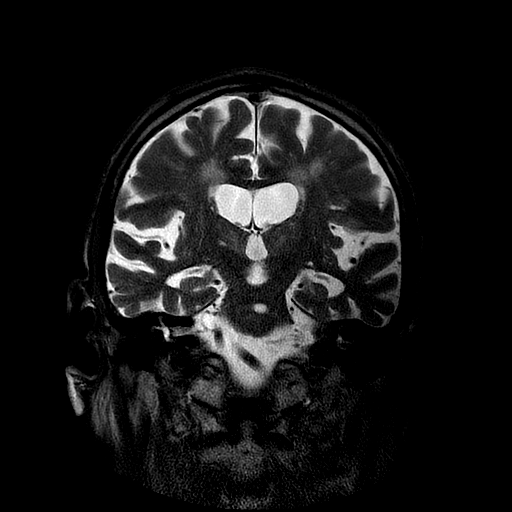
[im 28/28]
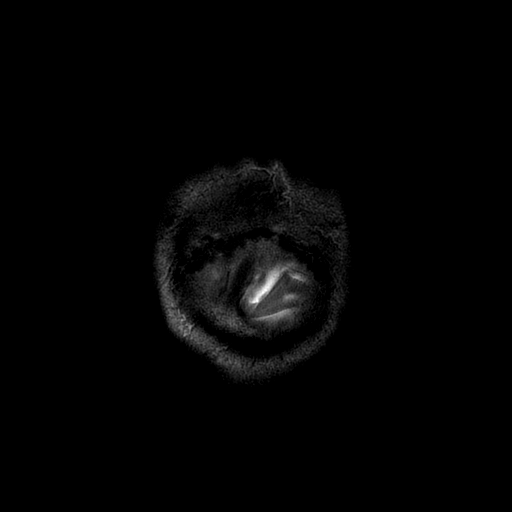

[Series 300: DWI · axial · 3.0mm · 1.09mm/px · z∈[-89,+57]mm · 5 of 50 slices shown (3 of 4)]
[im 1/50]
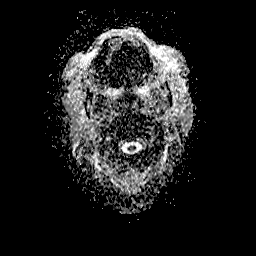
[im 13/50]
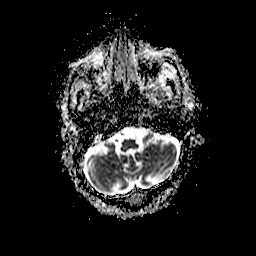
[im 25/50]
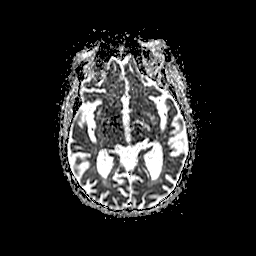
[im 37/50]
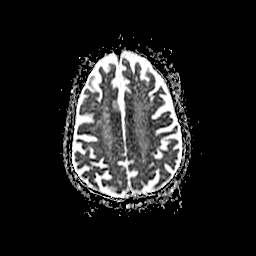
[im 50/50]
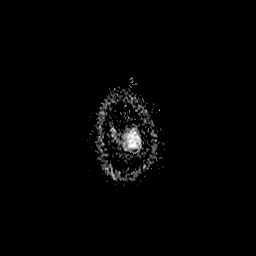

[Series 400: DWI · coronal · 5.0mm · 1.09mm/px · 3 of 33 slices shown (4 of 4)]
[im 1/33]
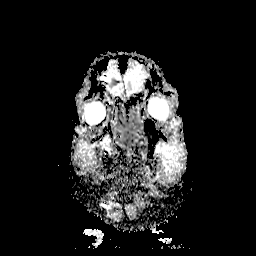
[im 17/33]
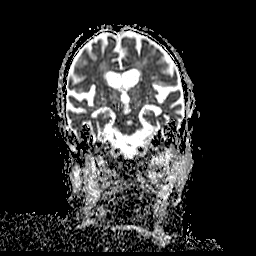
[im 33/33]
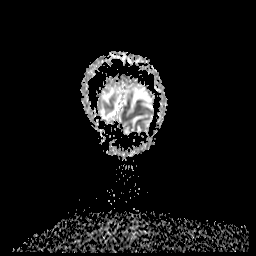

[37 of 48 positions shown; findings below may reference images not displayed]

FINDINGS: Multiple sequences are moderately motion degraded.

BRAIN: No reduced diffusion to suggest acute ischemia.
Susceptibility artifact RIGHT basal ganglia associated with old
infarct. No susceptibility artifact to suggest recent hemorrhage.
Old small LEFT cerebellar and multiple pontine lacunar infarcts.
Prominent bilateral basal ganglia and thalami perivascular spaces
associated with chronic small vessel ischemic disease. Subcentimeter
LEFT frontal periventricular subependymal or neural glial cyst. Mild
ex vacuo dilatation RIGHT lateral ventricle, moderate global
parenchymal brain volume loss without hydrocephalus. Patchy to
confluent supratentorial white matter FLAIR T2 hyperintensities. No
suspicious parenchymal signal, mass or mass effect. No abnormal
extra-axial fluid collections.

VASCULAR: Normal major intracranial vascular flow voids present at
skull base.

SKULL AND UPPER CERVICAL SPINE: No abnormal sellar expansion. No
suspicious calvarial bone marrow signal. Craniocervical junction
maintained.

SINUSES/ORBITS: Moderate paranasal sinus mucosal thickening. Imaged
mastoid air cells are well aerated. The included ocular globes and
orbital contents are non-suspicious.

OTHER: None.
IMPRESSION: 1. No acute intracranial process on this moderately motion degraded
examination.
2. Moderate to severe chronic small vessel ischemic disease and
multiple old small infarcts.

## 2019-03-06 IMAGING — CT CT HEAD W/O CM
4 series · 15 of 47 positions shown, 17 images · non-contrast
Comparison: 03/02/2010

CLINICAL DATA: Mental status changes and generalize weakness for 2
days.

EXAM:
CT HEAD WITHOUT CONTRAST
TECHNIQUE: Contiguous axial images were obtained from the base of the skull
through the vertex without intravenous contrast.

[Series 3: head wo · axial · 0.46mm/px · z∈[-106,+24]mm · 7 of 36 slices shown, 9 images]
[im 5/36  brain]
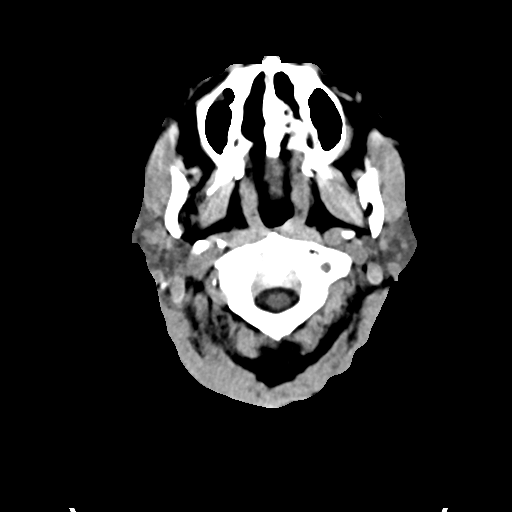
[im 5/36  bone]
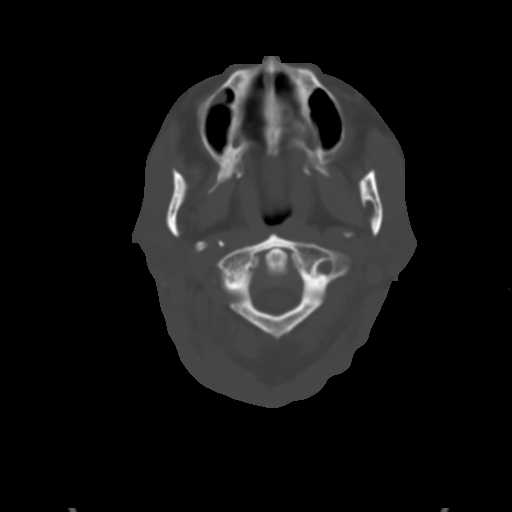
[im 9/36  brain]
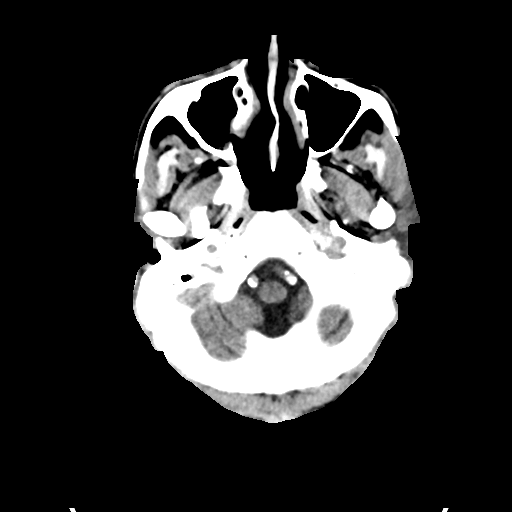
[im 14/36  brain]
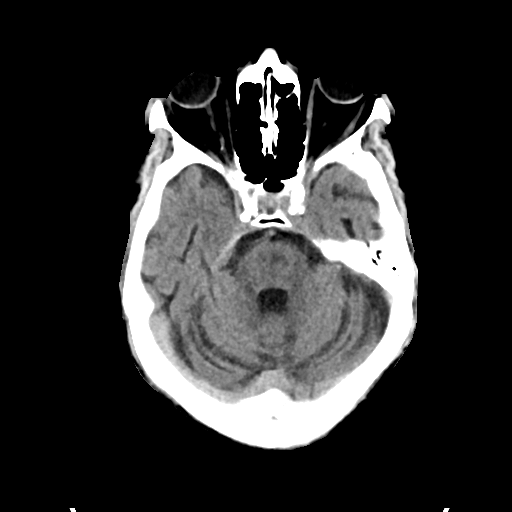
[im 18/36  brain]
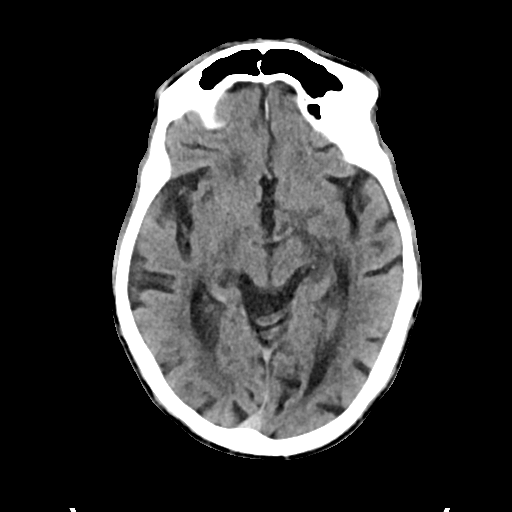
[im 22/36  brain]
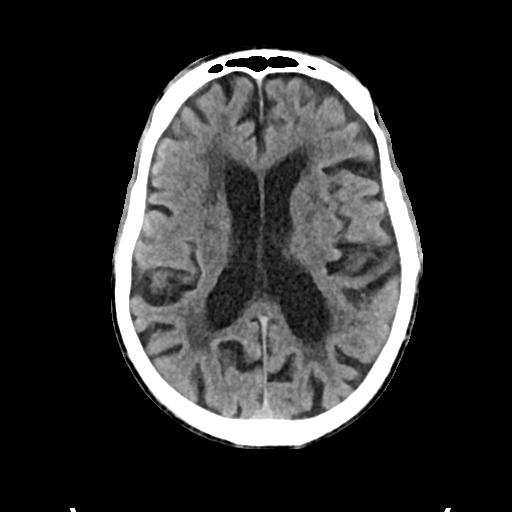
[im 22/36  bone]
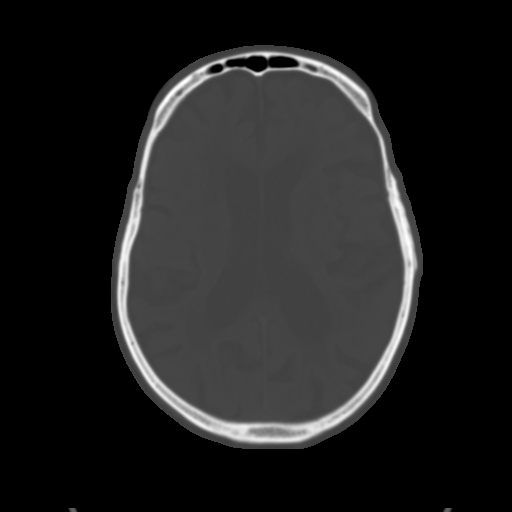
[im 27/36  brain]
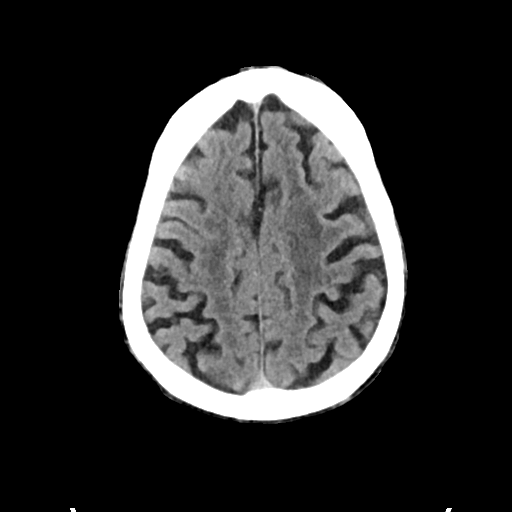
[im 31/36  brain]
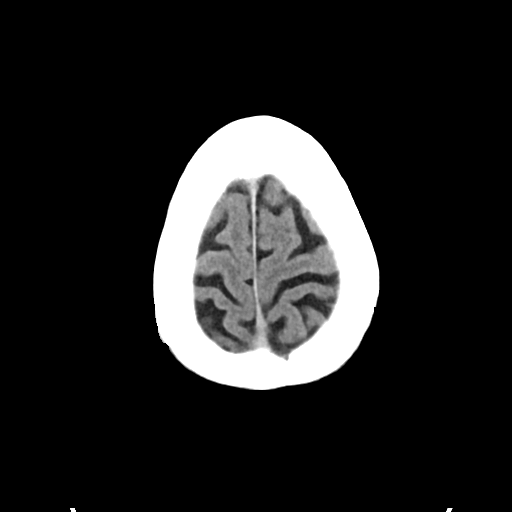

[Series 4: head bone · axial · 0.46mm/px · z∈[-110,-92]mm · 2 of 89 slices shown]
[im 9/89  bone]
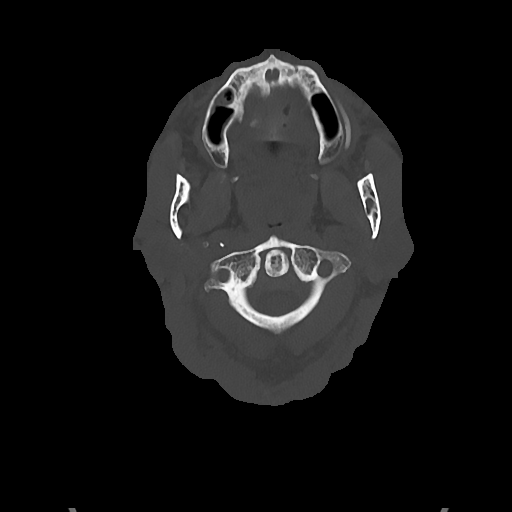
[im 18/89  bone]
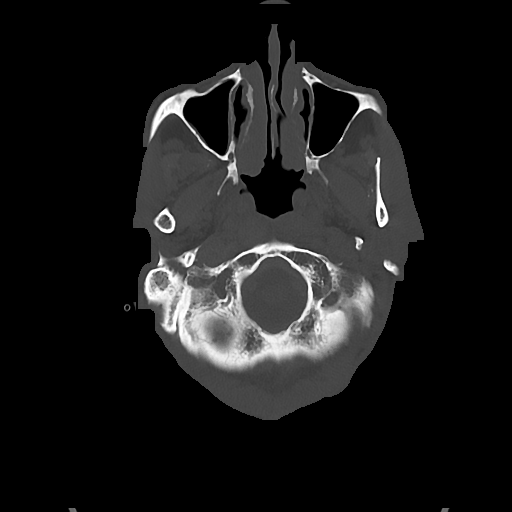

[Series 5: cor soft · coronal · 0.35mm/px · 3 of 75 slices shown]
[im 25/75  brain]
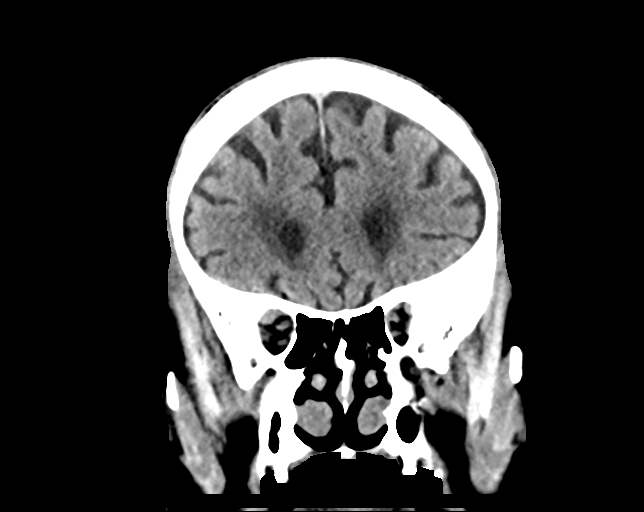
[im 33/75  brain]
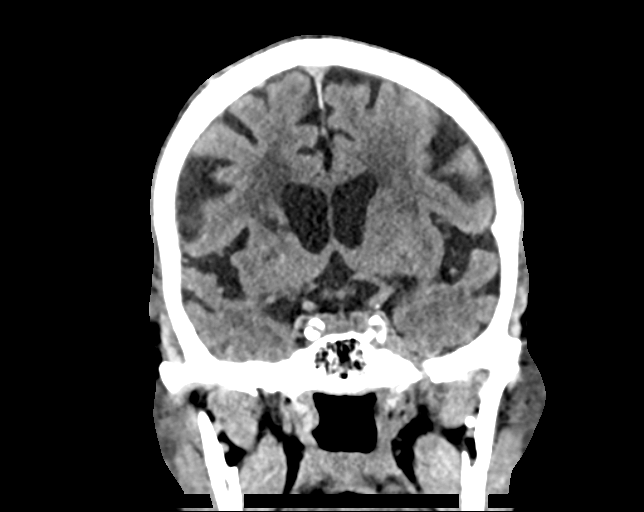
[im 42/75  brain]
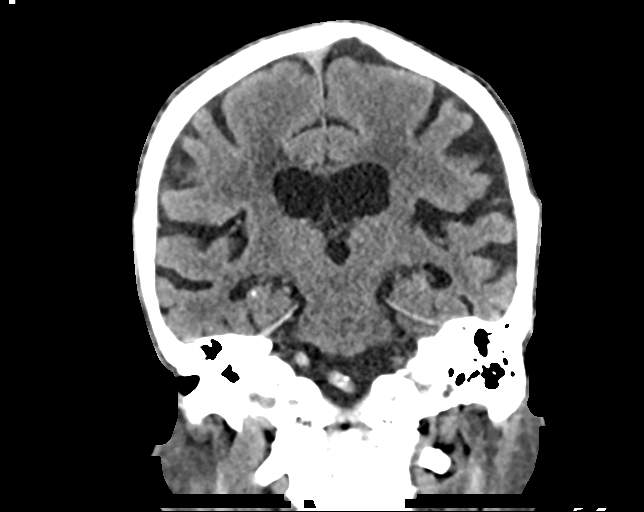

[Series 6: sag soft · sagittal · 0.35mm/px · 3 of 58 slices shown]
[im 20/58  brain]
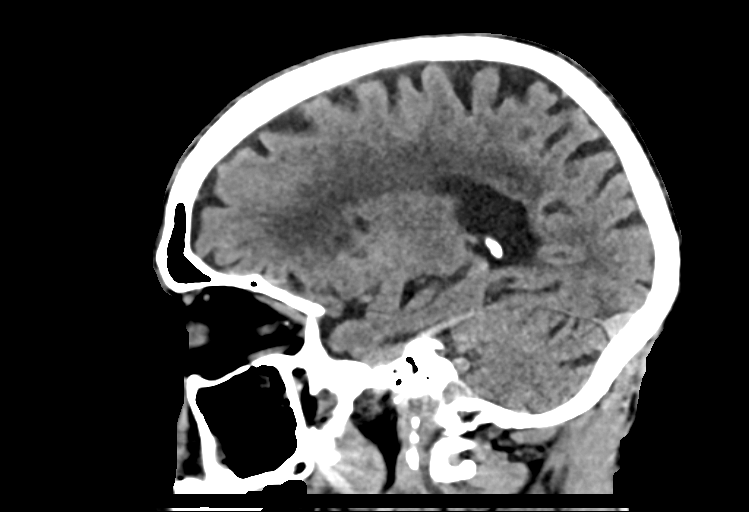
[im 29/58  brain]
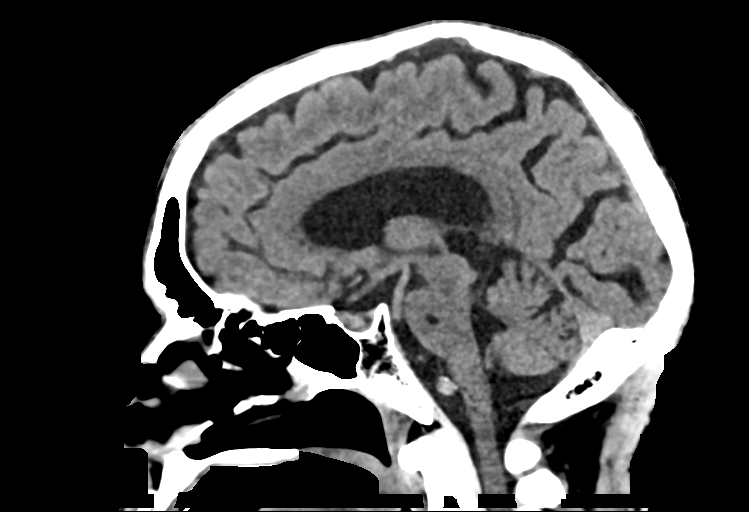
[im 39/58  brain]
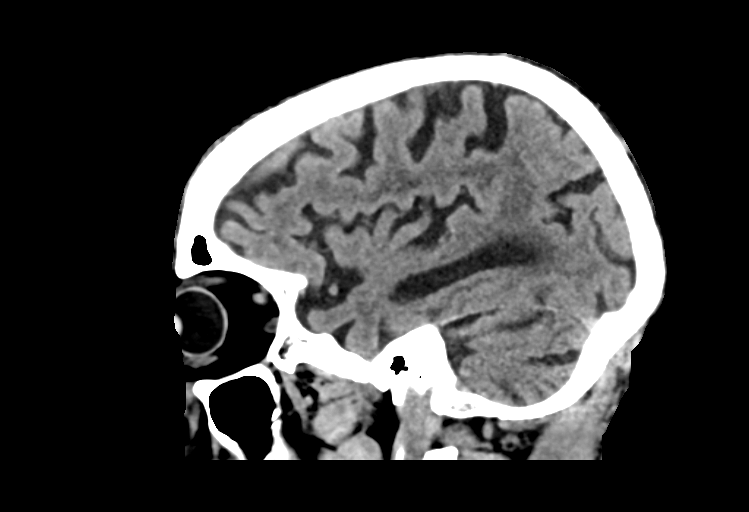

[15 of 47 positions shown; findings below may reference images not displayed]

FINDINGS: Brain: Progressive age related cerebral atrophy, ventriculomegaly
and periventricular white matter disease. Remote lacunar type basal
ganglia infarcts are noted. No CT findings for acute hemispheric
infarction or intracranial hemorrhage. No extra-axial fluid
collections are identified. The brainstem and cerebellum are grossly
normal.

Vascular: Stable advanced vascular calcifications but no hyperdense
vessels or obvious aneurysm.

Skull: No acute bony findings or skull fracture.

Sinuses/Orbits: The paranasal sinuses and mastoid air cells are
clear except for right maxillary sinus disease. The globes are
intact.

Other: No scalp lesion or hematoma.
IMPRESSION: 1. Progressive age related cerebral atrophy, ventriculomegaly and
periventricular white matter disease.
2. No acute intracranial findings or mass lesions.
3. No acute bony findings.
